# Patient Record
Sex: Male | Born: 1970 | Race: White | Hispanic: No | Marital: Single | Smoking: Heavy tobacco smoker
Health system: Southern US, Community
[De-identification: ages and names within clinical notes are randomized; demographics above are authoritative.]

## PROBLEM LIST (undated history)

## (undated) DIAGNOSIS — R45851 Suicidal ideations: Secondary | ICD-10-CM

---

## 2017-01-02 ENCOUNTER — Encounter (HOSPITAL_COMMUNITY): Payer: Self-pay | Admitting: Radiology

## 2017-01-02 ENCOUNTER — Emergency Department (HOSPITAL_COMMUNITY): Payer: Medicare HMO

## 2017-01-02 ENCOUNTER — Emergency Department (HOSPITAL_COMMUNITY)
Admission: EM | Admit: 2017-01-02 | Discharge: 2017-01-02 | Disposition: A | Discharge status: Home / Self Care | Payer: Medicare HMO | Attending: Emergency Medicine | Admitting: Emergency Medicine

## 2017-01-02 DIAGNOSIS — R1011 Right upper quadrant pain: Secondary | ICD-10-CM | POA: Insufficient documentation

## 2017-01-02 DIAGNOSIS — R110 Nausea: Secondary | ICD-10-CM | POA: Insufficient documentation

## 2017-01-02 DIAGNOSIS — R1013 Epigastric pain: Secondary | ICD-10-CM

## 2017-01-02 DIAGNOSIS — K8050 Calculus of bile duct without cholangitis or cholecystitis without obstruction: Secondary | ICD-10-CM

## 2017-01-02 LAB — CBC
HCT: 36.1 % — ABNORMAL LOW (ref 39.0–52.0)
Hemoglobin: 12.2 g/dL — ABNORMAL LOW (ref 13.0–17.0)
MCH: 30 pg (ref 26.0–34.0)
MCHC: 33.8 g/dL (ref 30.0–36.0)
MCV: 88.9 fL (ref 78.0–100.0)
PLATELETS: 246 10*3/uL (ref 150–400)
RBC: 4.06 MIL/uL — AB (ref 4.22–5.81)
RDW: 12.8 % (ref 11.5–15.5)
WBC: 14.7 10*3/uL — ABNORMAL HIGH (ref 4.0–10.5)

## 2017-01-02 LAB — HEPATIC FUNCTION PANEL
ALBUMIN: 3.1 g/dL — AB (ref 3.5–5.0)
ALK PHOS: 84 U/L (ref 38–126)
ALT: 77 U/L — AB (ref 17–63)
AST: 195 U/L — ABNORMAL HIGH (ref 15–41)
BILIRUBIN TOTAL: 0.7 mg/dL (ref 0.3–1.2)
Bilirubin, Direct: 0.2 mg/dL (ref 0.1–0.5)
Indirect Bilirubin: 0.5 mg/dL (ref 0.3–0.9)
Total Protein: 5.3 g/dL — ABNORMAL LOW (ref 6.5–8.1)

## 2017-01-02 LAB — BASIC METABOLIC PANEL
Anion gap: 6 (ref 5–15)
BUN: 19 mg/dL (ref 6–20)
CHLORIDE: 111 mmol/L (ref 101–111)
CO2: 24 mmol/L (ref 22–32)
CREATININE: 0.8 mg/dL (ref 0.61–1.24)
Calcium: 8.8 mg/dL — ABNORMAL LOW (ref 8.9–10.3)
GFR calc Af Amer: >60 mL/min (ref >60)
GFR calc non Af Amer: >60 mL/min (ref >60)
Glucose, Bld: 113 mg/dL — ABNORMAL HIGH (ref 65–99)
Potassium: 3.5 mmol/L (ref 3.5–5.1)
Sodium: 141 mmol/L (ref 135–145)

## 2017-01-02 LAB — I-STAT TROPONIN, ED: Troponin i, poc: 0.01 ng/mL (ref 0.00–0.08)

## 2017-01-02 LAB — LIPASE, BLOOD: LIPASE: 26 U/L (ref 11–51)

## 2017-01-02 MED ORDER — MORPHINE SULFATE (PF) 4 MG/ML IV SOLN
4.0000 mg | Freq: Once | INTRAVENOUS | Status: AC
  Administered 2017-01-02: 4 mg via INTRAVENOUS
  Filled 2017-01-02: qty 1

## 2017-01-02 MED ORDER — SODIUM CHLORIDE 0.9 % IV BOLUS (SEPSIS)
1000.0000 mL | Freq: Once | INTRAVENOUS | Status: AC
  Administered 2017-01-02: 1000 mL via INTRAVENOUS

## 2017-01-02 MED ORDER — ONDANSETRON HCL 4 MG/2ML IJ SOLN
4.0000 mg | Freq: Once | INTRAMUSCULAR | Status: AC
  Administered 2017-01-02: 4 mg via INTRAVENOUS
  Filled 2017-01-02: qty 2

## 2017-01-02 MED ORDER — IOPAMIDOL (ISOVUE-300) INJECTION 61%
INTRAVENOUS | Status: AC
  Administered 2017-01-02: 100 mL
  Filled 2017-01-02: qty 100

## 2017-01-02 NOTE — ED Provider Notes (Signed)
[facility]-EMER[facility]ENCY DEPT Provider Note   CSN: [id number] Arrival date & time: 01/02/17  0355     History   Chief Complaint Chief Complaint  Patient presents with  . Chest Pain    HPI Aura DialsSteven Range is a 46 y.o. male.  Patient is a 46 year old male with no prior medical history. He presents for evaluation of epigastric discomfort. This started several hours prior to presentation and is worsening. He feels nauseated but has not vomited. He denies any shortness of breath or diaphoresis. He denies any recent exertional symptoms.   The history is provided by the patient.  Abdominal Pain   This is a new problem. The current episode started 3 to 5 hours ago. The problem occurs constantly. The problem has been rapidly worsening. The pain is located in the epigastric region. The quality of the pain is cramping. The pain is moderate. Associated symptoms include nausea. Pertinent negatives include diarrhea and dysuria. The symptoms are aggravated by palpation. Nothing relieves the symptoms.    No past medical history on file.  There are no active problems to display for this patient.   No past surgical history on file.     Home Medications    Prior to Admission medications   Not on File    Family History No family history on file.  Social History Social History  Substance Use Topics  . Smoking status: Not on file  . Smokeless tobacco: Not on file  . Alcohol use Not on file     Allergies   Patient has no allergy information on record.   Review of Systems Review of Systems  Gastrointestinal: Positive for abdominal pain and nausea. Negative for diarrhea.  Genitourinary: Negative for dysuria.  All other systems reviewed and are negative.    Physical Exam Updated Vital Signs BP (!) 129/98   Pulse 85   Temp 97.8 F (36.6 C) (Oral)   Resp 18   SpO2 94%   Physical Exam  Constitutional: He is oriented to person, place, and time. He appears well-developed and  well-nourished. No distress.  HENT:  Head: Normocephalic and atraumatic.  Mouth/Throat: Oropharynx is clear and moist.  Neck: Normal range of motion. Neck supple.  Cardiovascular: Normal rate and regular rhythm.  Exam reveals no friction rub.   No murmur heard. Pulmonary/Chest: Effort normal and breath sounds normal. No respiratory distress. He has no wheezes. He has no rales.  Abdominal: Soft. Bowel sounds are normal. He exhibits no distension. There is tenderness. There is no rebound and no guarding.  There is tenderness to palpation in the epigastric region.  Musculoskeletal: Normal range of motion. He exhibits no edema.  Neurological: He is alert and oriented to person, place, and time. Coordination normal.  Skin: Skin is warm and dry. He is not diaphoretic.  Nursing note and vitals reviewed.    ED Treatments / Results  Labs (all labs ordered are listed, but only abnormal results are displayed) Labs Reviewed  BASIC METABOLIC PANEL - Abnormal; Notable for the following:       Result Value   Glucose, Bld 113 (*)    Calcium 8.8 (*)    All other components within normal limits  CBC - Abnormal; Notable for the following:    WBC 14.7 (*)    RBC 4.06 (*)    Hemoglobin 12.2 (*)    HCT 36.1 (*)    All other components within normal limits  HEPATIC FUNCTION PANEL  LIPASE, BLOOD  I-STAT TROPOININ, ED  EKG  EKG Interpretation  Date/Time:  Wednesday January 02 2017 03:55:51 EDT Ventricular Rate:  100 PR Interval:    QRS Duration: 85 QT Interval:  329 QTC Calculation: 425 R Axis:   42 Text Interpretation:  Sinus tachycardia Probable left atrial enlargement ST elev, probable normal early repol pattern Confirmed by Geoffery Lyons ([id number]) on 01/02/2017 4:44:00 AM       Radiology Dg Chest 2 View  Result Date: 01/02/2017 CLINICAL DATA:  Acute onset of mid lower chest pain and epigastric abdominal pain. Initial encounter. EXAM: CHEST  2 VIEW COMPARISON:  None. FINDINGS: The lungs  are well-aerated and clear. There is no evidence of focal opacification, pleural effusion or pneumothorax. The heart is normal in size; the mediastinal contour is within normal limits. No acute osseous abnormalities are seen. IMPRESSION: No acute cardiopulmonary process seen. Electronically Signed   By: Roanna Raider M.D.   On: 01/02/2017 04:40    Procedures Procedures (including critical care time)  Medications Ordered in ED Medications  sodium chloride 0.9 % bolus 1,000 mL (not administered)  morphine 4 MG/ML injection 4 mg (not administered)  ondansetron (ZOFRAN) injection 4 mg (not administered)     Initial Impression / Assessment and Plan / ED Course  I have reviewed the triage vital signs and the nursing notes.  Pertinent labs & imaging results that were available during my care of the patient were reviewed by me and considered in my medical decision making (see chart for details).  Patient presents with complaints of epigastric and right upper quadrant pain that started this evening. His workup reveals a white count of 15,000 with a mild bump in his LFTs. Ultrasound shows sludge, but no evidence for cholecystitis. Patient has been given 2 separate doses of pain medicine, and continues with significant discomfort. For this reason, a CT scan of the abdomen will be obtained to rule out alternate intra-abdominal pathology. His care will be signed out to Dr. Ethelda Chick at shift change. He will obtain the results of the CT scan of determine the final disposition.  Final Clinical Impressions(s) / ED Diagnoses   Final diagnoses:  Epigastric pain    New Prescriptions New Prescriptions   No medications on file     Geoffery Lyons, MD 01/03/17 [phone]

## 2017-01-02 NOTE — ED Notes (Signed)
EDP at bedside  

## 2017-01-02 NOTE — Discharge Instructions (Signed)
It is okay to take Advil for pain. Call [facility] to schedule the next available appointment. Tell office staff that you were seen here. Return for fever, vomiting or pain not well controlled or if concern for any reason

## 2017-01-02 NOTE — ED Provider Notes (Addendum)
Complains of right upper quadrant pain onset 1 AM today. Pain is constant. He admits to eating pizza 10 PM yesterday. No nausea or vomiting. Nothing makes symptoms better or worse.. No fever. No chest pain. On exam alert no distress lungs clear auscultation heart regular rate and rhythm abdomen nondistended tender right upper quadrant without guarding rigidity or rebound   Doug Sou, MD 01/02/17 0827 9:50 AM patient is sleeping easily arousable. No distress. States pain has improved. I discussed case with Ms. Focht,, PA-C who in turn discussed case with her attending Dr. Derrell Lolling. Plan patient is to follow up at [facility] surgery clinic to call for appointment.  Symptoms are consistent with biliary colic. Plan Advil for pain. Return precautions given fever, vomiting, pain not well controlled Results for orders placed or performed during the hospital encounter of 01/02/17  Basic metabolic panel  Result Value Ref Range   Sodium 141 135 - 145 mmol/L   Potassium 3.5 3.5 - 5.1 mmol/L   Chloride 111 101 - 111 mmol/L   CO2 24 22 - 32 mmol/L   Glucose, Bld 113 (H) 65 - 99 mg/dL   BUN 19 6 - 20 mg/dL   Creatinine, Ser 1.61 0.61 - 1.24 mg/dL   Calcium 8.8 (L) 8.9 - 10.3 mg/dL   GFR calc non Af Amer >60 >60 mL/min   GFR calc Af Amer >60 >60 mL/min   Anion gap 6 5 - 15  CBC  Result Value Ref Range   WBC 14.7 (H) 4.0 - 10.5 K/uL   RBC 4.06 (L) 4.22 - 5.81 MIL/uL   Hemoglobin 12.2 (L) 13.0 - 17.0 g/dL   HCT 09.6 (L) 04.5 - 40.9 %   MCV 88.9 78.0 - 100.0 fL   MCH 30.0 26.0 - 34.0 pg   MCHC 33.8 30.0 - 36.0 g/dL   RDW 81.1 91.4 - 78.2 %   Platelets 246 150 - 400 K/uL  Hepatic function panel  Result Value Ref Range   Total Protein 5.3 (L) 6.5 - 8.1 g/dL   Albumin 3.1 (L) 3.5 - 5.0 g/dL   AST 956 (H) 15 - 41 U/L   ALT 77 (H) 17 - 63 U/L   Alkaline Phosphatase 84 38 - 126 U/L   Total Bilirubin 0.7 0.3 - 1.2 mg/dL   Bilirubin, Direct 0.2 0.1 - 0.5 mg/dL   Indirect Bilirubin 0.5 0.3 -  0.9 mg/dL  Lipase, blood  Result Value Ref Range   Lipase 26 11 - 51 U/L  I-stat troponin, ED  Result Value Ref Range   Troponin i, poc 0.01 0.00 - 0.08 ng/mL   Comment 3           Dg Chest 2 View  Result Date: 01/02/2017 CLINICAL DATA:  Acute onset of mid lower chest pain and epigastric abdominal pain. Initial encounter. EXAM: CHEST  2 VIEW COMPARISON:  None. FINDINGS: The lungs are well-aerated and clear. There is no evidence of focal opacification, pleural effusion or pneumothorax. The heart is normal in size; the mediastinal contour is within normal limits. No acute osseous abnormalities are seen. IMPRESSION: No acute cardiopulmonary process seen. Electronically Signed   By: Roanna Raider M.D.   On: 01/02/2017 04:40   Ct Abdomen Pelvis W Contrast  Result Date: 01/02/2017 CLINICAL DATA:  Epigastric and right upper quadrant abdominal pain with leukocytosis. EXAM: CT ABDOMEN AND PELVIS WITH CONTRAST TECHNIQUE: Multidetector CT imaging of the abdomen and pelvis was performed using the standard protocol following bolus administration  of intravenous contrast. CONTRAST:  100mL ISOVUE-300 IOPAMIDOL (ISOVUE-300) INJECTION 61% COMPARISON:  Right upper quadrant abdominal ultrasound earlier today. FINDINGS: Lower chest: No acute abnormality. Hepatobiliary: The liver demonstrates diffuse steatosis. No overt cirrhotic changes or evidence of focal hepatic masses. No biliary ductal dilatation identified. The gallbladder appears unremarkable by CT. Pancreas: Unremarkable. No pancreatic ductal dilatation or surrounding inflammatory changes. Spleen: Normal in size without focal abnormality. Adrenals/Urinary Tract: Adrenal glands are unremarkable. Kidneys are normal, without renal calculi, focal lesion, or hydronephrosis. Bladder is unremarkable. Stomach/Bowel: Bowel shows no evidence of inflammation or obstruction. No lesions identified. No abnormal fluid collections or free intraperitoneal air. The appendix is not  well visualized. No findings to suggest acute appendicitis. Vascular/Lymphatic: No significant vascular findings are present. No enlarged abdominal or pelvic lymph nodes. Reproductive: Prostate is unremarkable. Other: No ascites. Anterior abdominal wall hernia mesh identified without evidence of hernia. Musculoskeletal: No acute or significant osseous findings. Evidence of prior posterior lumbar interbody fusion at L4-5. IMPRESSION: 1. No acute findings in the abdomen or pelvis. 2. Evidence of hepatic steatosis without overt cirrhosis or focal hepatic lesion. 3. Prior ventral abdominal wall hernia repair with mesh present. No evidence of recurrent hernia. Electronically Signed   By: Irish LackGlenn  Yamagata M.D.   On: 01/02/2017 08:30   [place] Abdomen Limited Ruq  Result Date: 01/02/2017 CLINICAL DATA:  Epigastric pain EXAM: ULTRASOUND ABDOMEN LIMITED RIGHT UPPER QUADRANT COMPARISON:  None. FINDINGS: Gallbladder: Arm sludge within the gallbladder. No stones or wall thickening. Negative sonographic Murphy's. Common bile duct: Diameter: Normal caliber 3 mm. Liver: Increased echotexture compatible with fatty infiltration. No focal abnormality or biliary ductal dilatation. IMPRESSION: Fatty infiltration of the liver. Small amount of sludge in the gallbladder. No stones or evidence of cholecystitis. Electronically Signed   By: Charlett NoseKevin  Dover M.D.   On: 01/02/2017 07:09     Doug SouJacubowitz, Makelle Marrone, MD 01/02/17 1001

## 2017-01-02 NOTE — ED Triage Notes (Signed)
PT from home via [facility]. Pt c/o centralized CP that radiates to his back. Endorses diaphoresis, weakness, SHOB & pain on inspiration. Did heavy moving x1 wk ago. Non compliant w/ prescribed meds for HTN. Given 324 ASA & 2 NTG PTA by EMS. A&O x4 on arrival.

## 2017-03-10 ENCOUNTER — Inpatient Hospital Stay (HOSPITAL_COMMUNITY)
Admission: AD | Admit: 2017-03-10 | Discharge: 2017-03-19 | DRG: 885 | Disposition: A | Discharge status: Home / Self Care | Payer: PRIVATE HEALTH INSURANCE | Source: Intra-hospital | Attending: Psychiatry | Admitting: Psychiatry

## 2017-03-10 ENCOUNTER — Encounter (HOSPITAL_COMMUNITY): Payer: Self-pay

## 2017-03-10 DIAGNOSIS — Z598 Other problems related to housing and economic circumstances: Secondary | ICD-10-CM | POA: Diagnosis not present

## 2017-03-10 DIAGNOSIS — Z590 Homelessness: Secondary | ICD-10-CM

## 2017-03-10 DIAGNOSIS — R45851 Suicidal ideations: Secondary | ICD-10-CM | POA: Diagnosis present

## 2017-03-10 DIAGNOSIS — F17210 Nicotine dependence, cigarettes, uncomplicated: Secondary | ICD-10-CM | POA: Diagnosis present

## 2017-03-10 DIAGNOSIS — M25512 Pain in left shoulder: Secondary | ICD-10-CM | POA: Diagnosis present

## 2017-03-10 DIAGNOSIS — G8929 Other chronic pain: Secondary | ICD-10-CM | POA: Diagnosis present

## 2017-03-10 DIAGNOSIS — F1121 Opioid dependence, in remission: Secondary | ICD-10-CM | POA: Diagnosis present

## 2017-03-10 DIAGNOSIS — R450 Nervousness: Secondary | ICD-10-CM | POA: Diagnosis not present

## 2017-03-10 DIAGNOSIS — F411 Generalized anxiety disorder: Secondary | ICD-10-CM | POA: Diagnosis present

## 2017-03-10 DIAGNOSIS — I10 Essential (primary) hypertension: Secondary | ICD-10-CM | POA: Diagnosis present

## 2017-03-10 DIAGNOSIS — Z79899 Other long term (current) drug therapy: Secondary | ICD-10-CM | POA: Diagnosis not present

## 2017-03-10 DIAGNOSIS — F332 Major depressive disorder, recurrent severe without psychotic features: Principal | ICD-10-CM | POA: Diagnosis present

## 2017-03-10 DIAGNOSIS — F1290 Cannabis use, unspecified, uncomplicated: Secondary | ICD-10-CM | POA: Diagnosis not present

## 2017-03-10 DIAGNOSIS — G4709 Other insomnia: Secondary | ICD-10-CM | POA: Diagnosis present

## 2017-03-10 DIAGNOSIS — G4700 Insomnia, unspecified: Secondary | ICD-10-CM | POA: Diagnosis not present

## 2017-03-10 DIAGNOSIS — F419 Anxiety disorder, unspecified: Secondary | ICD-10-CM | POA: Diagnosis not present

## 2017-03-10 MED ORDER — NICOTINE 21 MG/24HR TD PT24
21.0000 mg | MEDICATED_PATCH | Freq: Every day | TRANSDERMAL | Status: DC
  Administered 2017-03-10 – 2017-03-15 (×6): 21 mg via TRANSDERMAL
  Filled 2017-03-10 (×9): qty 1
  Filled 2017-03-10: qty 7
  Filled 2017-03-10 (×3): qty 1

## 2017-03-10 MED ORDER — ACETAMINOPHEN 325 MG PO TABS
650.0000 mg | ORAL_TABLET | Freq: Four times a day (QID) | ORAL | Status: DC | PRN
  Administered 2017-03-10: 650 mg via ORAL
  Filled 2017-03-10: qty 2

## 2017-03-10 MED ORDER — TRAZODONE HCL 50 MG PO TABS
50.0000 mg | ORAL_TABLET | Freq: Every evening | ORAL | Status: DC | PRN
  Administered 2017-03-10 – 2017-03-15 (×6): 50 mg via ORAL
  Filled 2017-03-10 (×6): qty 1

## 2017-03-10 MED ORDER — ALUM & MAG HYDROXIDE-SIMETH 200-200-20 MG/5ML PO SUSP: 30.0000 mL | ORAL | Status: DC | PRN

## 2017-03-10 MED ORDER — MAGNESIUM HYDROXIDE 400 MG/5ML PO SUSP
30.0000 mL | Freq: Every day | ORAL | Status: DC | PRN
  Administered 2017-03-14: 30 mL via ORAL
  Filled 2017-03-10: qty 30

## 2017-03-10 MED ORDER — HYDROXYZINE HCL 25 MG PO TABS
25.0000 mg | ORAL_TABLET | Freq: Three times a day (TID) | ORAL | Status: DC | PRN
  Administered 2017-03-10 – 2017-03-18 (×10): 25 mg via ORAL
  Filled 2017-03-10 (×10): qty 1
  Filled 2017-03-10: qty 10

## 2017-03-10 NOTE — BH Assessment (Signed)
Tele Assessment Note   Patient Name: Dominic DialsSteven Spencer MRN: [id number] Referring Physician: [facility] Direct Admit / Donnita FallsJody Osborne, MD Location of Patient: [facility]-400B IP ADULT Location of Provider: Behavioral Health TTS Department  Dominic DialsSteven Spencer is an 46 y.o. male who [facility] as [facility] for his suicidal ideation and depression. Patient  reported increasing depression over the past several days.  He states that he was experiencing suicidal ideation, but did not identify a plan. Patient admitted to a history of depression and prior treatment with medications, but stated that he hd been off his medications for several months.  He stated that he recently moved [place] to [place].  Patient states that he started drinking approximately four days ago to self-medicate his depressive symptoms. His depressive symptoms included: increased anxiety, feeling hopeless, excessive worrying.  Patient admits to drinking 2-3 beers daily over the past four days.  He also admits to occasional use of marijuana.  Patient did not disclose a history of opioid addiction to the referring hospital, but most likely he has an opioid addiction history because he is prescribed suboxone.  Patient is currently homeless and has minimal support.  He was unable to contract for safety and was requesting admission to an inpatient psych hospital.  Due to his inability to contract for safety, his lack of support and resources, he was accepted [facility].  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  No past surgical history on file.  Family History: No family history on file.  Social History:  has no tobacco, alcohol, and drug history on file.  Additional Social History:  Alcohol / Drug Use Pain Medications: denies (hx of opioid addiction, currently prescribed  subutex) Prescriptions: denies (has prescription medications, no history of abusing them) Over the Counter: denies (denies) History of alcohol / drug use?: Yes Longest period of sobriety (when/how long):  (unknown) Negative Consequences of Use: Financial Withdrawal Symptoms:  (none reported) Substance #1 Name of Substance 1: alcohol (alcohol) 1 - Age of First Use: unknown 1 - Amount (size/oz): 2-3 beers 1 - Frequency: daily 1 - Duration: 4 months 1 - Last Use / Amount: today  CIWA:   COWS:    PATIENT STRENGTHS: (choose at least two) Ability for insight Motivation for treatment/growth  Allergies: No Known Allergies  Home Medications:  No prescriptions prior to admission.    OB/GYN Status:  No LMP for male patient.  General Assessment Data Location of Assessment: [facility] Assessment Services TTS Assessment: Out of system Is this a Tele or Face-to-Face Assessment?: Tele Assessment Is this an Initial Assessment or a Re-assessment for this encounter?: Initial Assessment Marital status: Single Maiden name:  (not assessed) Is patient pregnant?:  (N/A) Pregnancy Status:  (N/A) Living Arrangements: Alone Can pt return to current living arrangement?: Yes Admission Status: Voluntary Is patient capable of signing voluntary admission?: Yes Referral Source: Other [facility] Insurance type:  (none)  Medical Screening Exam [facility] Walk-in ONLY) Medical Exam completed: Yes  Crisis Care Plan Living Arrangements: Alone Legal Guardian:  [facility] Name of Psychiatrist:  (just moved from [place], has no provider) Name of Therapist:  (none reported)  Education Status Is patient currently in school?: No Current Grade:  (NA) Highest grade of school patient has completed:  (not assessed) Name of school:  (unknown) Contact person:  ([place])  Risk to self with the past 6  months Suicidal Ideation: Yes-Currently Present (no plan, unable to contract for safety) Has patient been  a risk to self within the past 6 months prior to admission? : No Suicidal Intent: No Has patient had any suicidal intent within the past 6 months prior to admission? : No Is patient at risk for suicide?: Yes (not contracting for safety) Suicidal Plan?: No Has patient had any suicidal plan within the past 6 months prior to admission? : No Access to Means: No What has been your use of drugs/alcohol within the last 12 months?:  (drinking for the past four days and has  hx of THC use) Previous Attempts/Gestures: No How many times?:  (none reported) Other Self Harm Risks:  (homeless, unemplyed and minimal support) Triggers for Past Attempts: Unknown (no prior attempts reported) Intentional Self Injurious Behavior: None Family Suicide History:  (not assessed/none reported) Recent stressful life event(s): Other (Comment) (homeless, unemployed and recent move) Persecutory voices/beliefs?: No Depression:  (Depressed Mood, hopeless and has anxiety) Depression Symptoms: Fatigue, Loss of interest in usual pleasures Substance abuse history and/or treatment for substance abuse?: Yes (no specifics reported but is prescribed subutex) Suicide prevention information given to non-admitted patients: Not applicable  Risk to Others within the past 6 months Homicidal Ideation: No Does patient have any lifetime risk of violence toward others beyond the six months prior to admission? : No Thoughts of Harm to Others: No Current Homicidal Intent: No Current Homicidal Plan: No Access to Homicidal Means: No Identified Victim:  (none) History of harm to others?: No (none reported) Assessment of Violence:  (none reported) Violent Behavior Description:  (none reported) Does patient have access to weapons?: No (none reported) Criminal Charges Pending?: No Does patient have a court date: No Is patient on probation?: No  Psychosis Hallucinations: None noted Delusions: None noted  Mental Status  Report Appearance/Hygiene: Unremarkable Eye Contact: Good Motor Activity: Unremarkable Speech: Unremarkable Level of Consciousness: Alert Mood: Depressed, Anxious Affect: Other (Comment) (congruent with mood) Anxiety Level: Moderate (congruent with mood) Thought Processes: Coherent Judgement: Impaired Orientation: Person, Place, Time, Situation Obsessive Compulsive Thoughts/Behaviors: None  Cognitive Functioning Concentration: Normal Memory: Recent Intact, Remote Intact IQ: Average Insight: Good Impulse Control: Fair Appetite: Good Weight Loss:  (none reported) Weight Gain:  (none reported) Sleep: No Change Total Hours of Sleep:  (not assessed) Vegetative Symptoms: None  ADLScreening [facility] Assessment Services) Patient's cognitive ability adequate to safely complete daily activities?: Yes Patient able to express need for assistance with ADLs?: Yes Independently performs ADLs?: Yes (appropriate for developmental age)  Prior Inpatient Therapy Prior Inpatient Therapy:  (unknown) Prior Therapy Dates:  (none reported) Prior Therapy Facilty/Provider(s):  (none reported) Reason for Treatment:  (none reported)  Prior Outpatient Therapy Prior Outpatient Therapy:  (unknown/not assessed) Prior Therapy Dates:  (unknown/not assessed) Prior Therapy Facilty/Provider(s):  (unknown/not assessed) Reason for Treatment:  (unknown/not assessed) Does patient have an ACCT team?: No Does patient have Intensive In-House Services?  : No Does patient have [facility] services? : No Does patient have P4CC services?: No  ADL Screening (condition at time of admission) Patient's cognitive ability adequate to safely complete daily activities?: Yes Is the patient deaf or have difficulty hearing?: No Does the patient have difficulty seeing, even when wearing glasses/contacts?: No Does the patient have difficulty concentrating, remembering, or making decisions?: No Patient able to express need for  assistance with ADLs?: Yes Does the patient have difficulty dressing or bathing?: No Independently performs ADLs?: Yes (appropriate for developmental age) Does the patient have  difficulty walking or climbing stairs?: No Weakness of Legs: None Weakness of Arms/Hands: None             Advance Directives (For Healthcare) Does Patient Have a Medical Advance Directive?:  (unknown)    Additional Information 1:1 In Past 12 Months?: No CIRT Risk: No Elopement Risk: No Does patient have medical clearance?: No  Child/Adolescent Assessment Running Away Risk:  (N/A) Bed-Wetting:  (N/A) Destruction of Property:  (N/A) Cruelty to Animals:  (N/A) Stealing:  (N/A) Rebellious/Defies Authority:  (N/A) Satanic Involvement:  (N/A) Fire Setting:  (N/A) Problems at School:  (N/A) Gang Involvement:  (N/A)  Disposition:  Inpatient Psych Disposition Initial Assessment Completed for this Encounter: Yes Disposition of Patient: Inpatient treatment program Type of inpatient treatment program: Adult  This service was provided via telemedicine using a 2-way, interactive audio and Immunologist.  Names of all persons participating in this telemedicine service and their role in this encounter. Name:Brandi Levette Role: LPC, Triage Specialist  Name: Nadiah Corbit Role: Jamesetta Geralds Specialist  Name: Role:   Name:  Role:     Daphene Calamity 03/10/2017 3:53 PM

## 2017-03-10 NOTE — Tx Team (Signed)
Initial Treatment Plan 03/10/2017 5:47 PM Dominic Spencer [id number]    PATIENT STRESSORS: Loss of housing and job.   PATIENT STRENGTHS: Motivation for treatment/growth   PATIENT IDENTIFIED PROBLEMS: Depression  Suicidality  Anxiety  "Ret rid of the pain."  "Right medications for depression and sleep."             DISCHARGE CRITERIA:  Ability to meet basic life and health needs Improved stabilization in mood, thinking, and/or behavior  PRELIMINARY DISCHARGE PLAN: Attend 12-step recovery group  PATIENT/FAMILY INVOLVEMENT: This treatment plan has been presented to and reviewed with the patient, Dominic Spencer.  The patient has been given the opportunity to ask questions and make suggestions.  Cheri Kearns, RN 03/10/2017, 5:47 PM

## 2017-03-10 NOTE — Progress Notes (Signed)
Writer spoke with patient 1:1 shortly after shift change. He inquired about his suboxene and toradol. Writer informed him about the procedure for his medications needing to be verified and the possibility of them not being ordered if not current. Writer informed him of medications available if needed and he requested something for anxiety which he received. Writer informed him that he would see a doctor on tomorrow to discuss his medications needed. He has been up in the dayroom watching tv and interacting appropriately with peers. Support given and safety maintained on unit with 15 min checks.

## 2017-03-10 NOTE — Progress Notes (Signed)
Patient did attend the evening speaker AA meeting.  

## 2017-03-10 NOTE — Progress Notes (Signed)
Patient is a 46 year old man admitted voluntarily from [facility]. Patient reports he was depressed and took himself to the hospital, where he told them, "I just want to die. I don't know what to do." Patient recently moved, with his GF and her kids from [place], into a friends trailer, with the friend and her family. This caused a great deal of friction between the two families and he and his GF and  kids, "Were kicked out. My GF and the kids went to a women's shelter. I tried to get into a men's shelter but was not able to. I have been couch hopping since then." Patient says he has been depressed, sleeping less and is anxious. Patient also reports, "I took about 20 Advil at one time about a month, then about an hour later I took another 7920. It didn't work and I never told anyone." patient is able to contract for safety while on the unit. Skin assessment shows, two vertical scars on either side of spin, on lower back from L4-5 fusion, R. 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 HX of hernia repair and bilateral bunionectomy. Patient reports history of multiple psychiatric admissions in [place]. Home meds:   Zubsolv   Neurontin    Prozac   Clonidine Paperwork completed. Personal belongings secured in locker. Oriented to unit. Q 15 minute checks initiated

## 2017-03-11 DIAGNOSIS — F332 Major depressive disorder, recurrent severe without psychotic features: Principal | ICD-10-CM

## 2017-03-11 DIAGNOSIS — F17210 Nicotine dependence, cigarettes, uncomplicated: Secondary | ICD-10-CM

## 2017-03-11 DIAGNOSIS — Z598 Other problems related to housing and economic circumstances: Secondary | ICD-10-CM

## 2017-03-11 DIAGNOSIS — F419 Anxiety disorder, unspecified: Secondary | ICD-10-CM

## 2017-03-11 DIAGNOSIS — I10 Essential (primary) hypertension: Secondary | ICD-10-CM

## 2017-03-11 MED ORDER — LISINOPRIL 10 MG PO TABS
10.0000 mg | ORAL_TABLET | Freq: Every day | ORAL | Status: DC
Start: 2017-03-11 — End: 2017-03-19
  Administered 2017-03-11 – 2017-03-19 (×9): 10 mg via ORAL
  Filled 2017-03-11 (×4): qty 1
  Filled 2017-03-11 (×2): qty 7
  Filled 2017-03-11 (×2): qty 1
  Filled 2017-03-11: qty 2
  Filled 2017-03-11: qty 1
  Filled 2017-03-11: qty 2
  Filled 2017-03-11 (×3): qty 1

## 2017-03-11 MED ORDER — GABAPENTIN 300 MG PO CAPS
300.0000 mg | ORAL_CAPSULE | Freq: Three times a day (TID) | ORAL | Status: DC
  Administered 2017-03-11 – 2017-03-12 (×4): 300 mg via ORAL
  Filled 2017-03-11 (×10): qty 1

## 2017-03-11 MED ORDER — IBUPROFEN 400 MG PO TABS
400.0000 mg | ORAL_TABLET | Freq: Four times a day (QID) | ORAL | Status: DC | PRN
  Administered 2017-03-11 – 2017-03-16 (×3): 400 mg via ORAL
  Filled 2017-03-11 (×3): qty 1

## 2017-03-11 MED ORDER — DULOXETINE HCL 30 MG PO CPEP
30.0000 mg | ORAL_CAPSULE | Freq: Every day | ORAL | Status: DC
  Administered 2017-03-11 – 2017-03-12 (×2): 30 mg via ORAL
  Filled 2017-03-11 (×4): qty 1

## 2017-03-11 NOTE — [facility] Group Notes (Signed)
LCSW Group Therapy Note   03/11/2017 1:15pm   Type of Therapy and Topic:  Group Therapy:  Overcoming Obstacles   Participation Level:  None--pt pulled out of group by MD at beginning of group. DID NOT ATTEND.   Description of Group:    In this group patients will be encouraged to explore what they see as obstacles to their own wellness and recovery. They will be guided to discuss their thoughts, feelings, and behaviors related to these obstacles. The group will process together ways to cope with barriers, with attention given to specific choices patients can make. Each patient will be challenged to identify changes they are motivated to make in order to overcome their obstacles. This group will be process-oriented, with patients participating in exploration of their own experiences as well as giving and receiving support and challenge from other group members.   Therapeutic Goals: 1. Patient will identify personal and current obstacles as they relate to admission. 2. Patient will identify barriers that currently interfere with their wellness or overcoming obstacles.  3. Patient will identify feelings, thought process and behaviors related to these barriers. 4. Patient will identify two changes they are willing to make to overcome these obstacles:      Summary of Patient Progress      Therapeutic Modalities:   Cognitive Behavioral Therapy Solution Focused Therapy Motivational Interviewing Relapse Prevention Therapy  Ledell PeoplesHeather N Smart, LCSW 03/11/2017 2:07 PM

## 2017-03-11 NOTE — Plan of Care (Signed)
Problem: Self-Concept: Goal: Ability to disclose and discuss suicidal ideas will improve Outcome: Progressing Pt able to verbalize increase anxiety level and requested an antianxiety medication. Pt verbalizes SI with no plan or intent. Pt verbally contracts for safety.

## 2017-03-11 NOTE — Progress Notes (Signed)
D: Pt presents with a flat affect and depressed mood. Pt rates depression 7/10. Anxiety 8/10. Hopelessness 6/10. Pt endorses suicidal thoughts with no plan or intent. Pt verbally contracts for safety. Pt denies AVH. Pt denies any withdrawal symptoms. Pt stated that he was on Suboxone but stopped about four months ago. Pt stated that he was started on Subutex while in the ED.  A: Medications reviewed with pt. Medications administered as ordered per MD. Verbal support provided. Pt encouraged to attend groups. 15 minute checks performed for safety. R: Pt receptive to tx.

## 2017-03-11 NOTE — [facility] Suicide Risk Assessment (Signed)
[facility] INPATIENT:  Family/Significant Other Suicide Prevention Education  Suicide Prevention Education:  Patient Refusal for Family/Significant Other Suicide Prevention Education: The patient Dominic DialsSteven Spencer has refused to provide written consent for family/significant other to be provided Family/Significant Other Suicide Prevention Education during admission and/or prior to discharge.  Physician notified.  Verdene LennertLauren C Rafferty Postlewait 03/11/2017, 12:36 PM

## 2017-03-11 NOTE — Plan of Care (Signed)
Problem: Safety: Goal: Periods of time without injury will increase Outcome: Progressing Pt safe on unit at this time

## 2017-03-11 NOTE — [facility] Suicide Risk Assessment (Addendum)
[facility]   Nursing information obtained from:   patient and chart Demographic factors:   Divorced, was living  with GF and her 4 children , [place]  Current Mental Status:   see below Loss Factors:   recent relocation from [place] of state, recently became homeless  Historical Factors:   reports history of bipolar disorder.  Risk Reduction Factors:   resilience   Total Time spent with patient: 45 minutes Principal Problem: MDD (major depressive disorder), recurrent severe, without psychosis (HCC) Diagnosis:   Patient Active Problem List   Diagnosis Date Noted  . MDD (major depressive disorder), recurrent severe, without psychosis (HCC) [F33.2] 03/10/2017    Continued Clinical Symptoms:  Alcohol Use Disorder Identification Test Final Score (AUDIT): 21 The "Alcohol Use Disorders Identification Test", Guidelines for Use in Primary Care, Second Edition.  World Science writerHealth Organization [facility](WHO). Score between 0-7:  no or low risk or alcohol related problems. Score between 8-15:  moderate risk of alcohol related problems. Score between 16-19:  high risk of alcohol related problems. Score 20 or above:  warrants further diagnostic evaluation for alcohol dependence and treatment.   CLINICAL FACTORS:  Patient is a 46 year old male, who presented to ED voluntarily due to depression, suicidal ideations. He reports neuro-vegetative symptoms of depression- decreased energy, poor sleep, mild anhedonia.  Denies history of psychosis. He attributes his depression partially to chronic pain, and also to significant psychosocial stressors ( recent relocation and currently homeless).   States he has been drinking 2-3 beers per day over the last several days, but denies history of alcohol abuse   He reports prior history of Bipolar Disorder. History of several prior psychiatric admissions, most recently several months ago. He states he attempted suicide 15  years ago by cutting wrist . Denies history of psychosis.  [place] . States he has arthritis on lower back, and has had hip replacement.   Denies alcohol abuse, reports prior history of taking more opiates than prescribed . Also reports cannabis use 1-2 x week.  Dx- Bipolar Disorder, Depressed versus Depression secondary to medical illness ( Chronic Pain)    Plan- Inpatient admission He has been started on Cymbalta 30 mgrs QDAY and on Neurontin 300 mgrs TID LFTs were elevated in July- will repeat and also obtain TSH   Musculoskeletal: Strength & Muscle Tone: within normal limits Gait & Station: normal Patient leans: N/A  Psychiatric Specialty Exam: Physical Exam  ROS no headache, no chest pain, no vomiting, lower back pain, no fever, no chills , no rash  Blood pressure (!) 142/90, pulse (!) 106, temperature 97.7 F (36.5 C), temperature source Oral, resp. rate 16, height 6\' 2"  (1.88 m), weight 96.4 kg (212 lb 8 oz), SpO2 100 %.Body mass index is 27.28 kg/m.  General Appearance: Well Groomed  Eye Contact:  Good  Speech:  Normal Rate, no pressured speech  Volume:  Normal  Mood:  depressed  Affect:  Appropriate and reactive, smiles at times appropriately, not agitated or expansive   Thought Process:  Linear and Descriptions of Associations: Intact  No flight of ideations  Orientation:  Full (Time, Place, and Person)  Thought Content:  no hallucinations, no delusions, not internally preoccupied   Suicidal Thoughts:  No denies suicidal plan or intention, contracts for safety at this time, no homicidal ideations   Homicidal Thoughts:  No  Memory:  recent and  remote grossly intact   Judgement:  Fair  Insight:  Fair  Psychomotor Activity:  Normal  Concentration:  Concentration: Good and Attention Span: Good  Recall:  Good  Fund of Knowledge:  Good  Language:  Good  Akathisia:  Negative  Handed:  Right  AIMS (if  indicated):     Assets:  Communication Skills Desire for Improvement Resilience  ADL's:  Intact  Cognition:  WNL  Sleep:  Number of Hours: 5.25      COGNITIVE FEATURES THAT CONTRIBUTE TO RISK:  Closed-mindedness and Loss of executive function    SUICIDE RISK:   Moderate:  Frequent suicidal ideation with limited intensity, and duration, some specificity in terms of plans, no associated intent, good self-control, limited dysphoria/symptomatology, some risk factors present, and identifiable protective factors, including available and accessible social support.  PLAN OF CARE: Patient will be admitted to inpatient psychiatric unit for stabilization and safety. Will provide and encourage milieu participation. Provide medication management and maked adjustments as needed.  Will follow daily.    I certify that inpatient services furnished can reasonably be expected to improve the patient's condition.   Craige Cotta, MD 03/11/2017, 1:54 PM

## 2017-03-11 NOTE — H&P (Signed)
Psychiatric Admission Assessment Adult  Patient Identification: Dominic Spencer MRN:  [id number] Date of Evaluation:  03/11/2017 Chief Complaint:  MDD SEVERE Principal Diagnosis: MDD (major depressive disorder), recurrent severe, without psychosis ([place]) Diagnosis:   Patient Active Problem List   Diagnosis Date Noted  . MDD (major depressive disorder), recurrent severe, without psychosis ([place]) [F33.2] 03/10/2017   History of Present Illness:  TTS Admission Note: Patient is a 46 year old man admitted voluntarily from [facility]. Patient reports he was depressed and took himself to the hospital, where he told them, "I just want to die. I don't know what to do." Patient recently moved, with his GF and her kids from [place], into a friends trailer, with the friend and her family. This caused a great deal of friction between the two families and he and his GF and  kids, "Were kicked out. My GF and the kids went to a women's shelter. I tried to get into a men's shelter but was not able to. I have been couch hopping since then." Patient says he has been depressed, sleeping less and is anxious. Patient also reports, "I took about 20 Advil at one time about a month, then about an hour later I took another 21. It didn't work and I never told anyone." patient is able to contract for safety while on the unit. Home meds listed are Zubsolv, Neurontin, Prozac, Lisinopril, and Clonidine.  Patient reports today that he moved from [facility] 4 months ago and has been off of all of his medications since then. He stated he was living with his parents and then he and his GF moved here to live with friends, but he will not detail why they moved. He reports that he has a lot of nerve pain and that is what the Zubsolv and Neurontin are for. I inform him that since he has no where to continue it outpatient and he has been off of it for 4 months then it will not be continued and he agrees and states understanding. He reports  depression but denies SI/HI/AVH. He does not appear depressed and has appropriate affect. He reports that he is depressed due to his situation, but he then states that he will go to a shelter when discharged from here. He agrees to start Cymbalta and Neurontin. Patient seem to giving limited information and may be seeking secondary gain from the hospital.  Associated Signs/Symptoms: Depression Symptoms:  depressed mood, hopelessness, suicidal thoughts without plan, (Hypo) Manic Symptoms:  Denies Anxiety Symptoms:  Excessive Worry, Psychotic Symptoms:  Denies PTSD Symptoms: NA Total Time spent with patient: 30 minutes  Past Psychiatric History: Opiate abuse, depression  Is the patient at risk to self? No.  Has the patient been a risk to self in the past 6 months? No.  Has the patient been a risk to self within the distant past? No.  Is the patient a risk to others? No.  Has the patient been a risk to others in the past 6 months? No.  Has the patient been a risk to others within the distant past? No.   Prior Inpatient Therapy: Prior Inpatient Therapy:  (unknown) Prior Therapy Dates:  (none reported) Prior Therapy Facilty/Provider(s):  (none reported) Reason for Treatment:  (none reported) Prior Outpatient Therapy: Prior Outpatient Therapy:  (unknown/not assessed) Prior Therapy Dates:  (unknown/not assessed) Prior Therapy Facilty/Provider(s):  (unknown/not assessed) Reason for Treatment:  (unknown/not assessed) Does patient have an ACCT team?: No Does patient have Intensive In-House Services?  :  No Does patient have [facility] services? : No Does patient have P4CC services?: No  Alcohol Screening: Patient refused Alcohol Screening Tool: Yes 1. How often do you have a drink containing alcohol?: 4 or more times a week 2. How many drinks containing alcohol do you have on a typical day when you are drinking?: 3 or 4 3. How often do you have six or more drinks on one occasion?: Less than  monthly Preliminary Score: 2 4. How often during the last year have you found that you were not able to stop drinking once you had started?: Weekly 5. How often during the last year have you failed to do what was normally expected from you becasue of drinking?: Weekly 6. How often during the last year have you needed a first drink in the morning to get yourself going after a heavy drinking session?: Less than monthly 7. How often during the last year have you had a feeling of guilt of remorse after drinking?: Monthly 8. How often during the last year have you been unable to remember what happened the night before because you had been drinking?: Monthly 9. Have you or someone else been injured as a result of your drinking?: No 10. Has a relative or friend or a doctor or another health worker been concerned about your drinking or suggested you cut down?: Yes, during the last year Alcohol Use Disorder Identification Test Final Score (AUDIT): 21 Brief Intervention: Patient declined brief intervention Substance Abuse History in the last 12 months:  Yes.   Consequences of Substance Abuse: Legal Consequences:  reviewed Family Consequences:  reviewed Previous Psychotropic Medications: Yes Prozac Psychological Evaluations: No  Past Medical History: History reviewed. No pertinent past medical history. History reviewed. No pertinent surgical history. Family History: History reviewed. No pertinent family history. Family Psychiatric  History: Dad - alcoholism, Cousin - suicide Tobacco Screening: Have you used any form of tobacco in the last 30 days? (Cigarettes, Smokeless Tobacco, Cigars, and/or Pipes): Yes Tobacco use, Select all that apply: 5 or more cigarettes per day Are you interested in Tobacco Cessation Medications?: Yes, will notify MD for an order Counseled patient on smoking cessation including recognizing danger situations, developing coping skills and basic information about quitting provided:  Refused/Declined practical counseling Social History:  History  Alcohol Use  . 10.8 oz/week  . 18 Cans of beer per week     History  Drug Use  . Types: Marijuana    Additional Social History: Marital status: Long term relationship Divorced, when?: "several years, probably 68" Long term relationship, how long?: 17yr What types of issues is patient dealing with in the relationship?: girlfriend is homeless Does patient have children?: No    Pain Medications: denies (hx of opioid addiction, currently prescribed subutex) Prescriptions: denies (has prescription medications, no history of abusing them) Over the Counter: denies (denies) History of alcohol / drug use?: Yes Longest period of sobriety (when/how long):  (unknown) Negative Consequences of Use: Financial Withdrawal Symptoms:  (none reported) Name of Substance 1: alcohol (alcohol) 1 - Age of First Use: unknown 1 - Amount (size/oz): 2-3 beers 1 - Frequency: daily 1 - Duration: 4 months 1 - Last Use / Amount: today                  Allergies:  No Known Allergies Lab Results: No results found for this or any previous visit (from the past 48 hour(s)).  Blood Alcohol level:  No results found for: [facility] Metabolic  Disorder Labs:  No results found for: HGBA1C, MPG No results found for: PROLACTIN No results found for: CHOL, TRIG, HDL, CHOLHDL, VLDL, LDLCALC  Current Medications: Current Facility-Administered Medications  Medication Dose Route Frequency Provider Last Rate Last Dose  . acetaminophen (TYLENOL) tablet 650 mg  650 mg Oral Q6H PRN Okonkwo, Justina A, NP   650 mg at 03/10/17 1956  . alum & mag hydroxide-simeth (MAALOX/MYLANTA) 200-200-20 MG/5ML suspension 30 mL  30 mL Oral Q4H PRN Okonkwo, Justina A, NP      . DULoxetine (CYMBALTA) DR capsule 30 mg  30 mg Oral Daily Money, Darnelle Maffucci B, FNP   30 mg at 03/11/17 1214  . gabapentin (NEURONTIN) capsule 300 mg  300 mg Oral TID Money, Lowry Ram, FNP   300 mg at  03/11/17 1214  . hydrOXYzine (ATARAX/VISTARIL) tablet 25 mg  25 mg Oral TID PRN Hughie Closs A, NP   25 mg at 03/11/17 0835  . lisinopril (PRINIVIL,ZESTRIL) tablet 10 mg  10 mg Oral Daily Money, Lowry Ram, FNP   10 mg at 03/11/17 1214  . magnesium hydroxide (MILK OF MAGNESIA) suspension 30 mL  30 mL Oral Daily PRN Okonkwo, Justina A, NP      . nicotine (NICODERM CQ - dosed in mg/24 hours) patch 21 mg  21 mg Transdermal Daily Arfeen, Arlyce Harman, MD   21 mg at 03/11/17 0836  . traZODone (DESYREL) tablet 50 mg  50 mg Oral QHS PRN Lu Duffel, Justina A, NP   50 mg at 03/10/17 2134   PTA Medications: Prescriptions Prior to Admission  Medication Sig Dispense Refill Last Dose  . Buprenorphine HCl-Naloxone HCl (ZUBSOLV) 5.7-1.4 MG SUBL Place 1 tablet under the tongue 2 (two) times daily.   Past Month at Unknown time  . buPROPion (WELLBUTRIN XL) 150 MG 24 hr tablet Take 150 mg by mouth daily.   Past Month at Unknown time  . cloNIDine (CATAPRES) 0.1 MG tablet Take 0.1 mg by mouth 3 (three) times daily.   Past Month at Unknown time  . FLUoxetine (PROZAC) 20 MG capsule Take 20 mg by mouth daily.   Past Month at Unknown time  . gabapentin (NEURONTIN) 800 MG tablet Take 800 mg by mouth 3 (three) times daily.   Past Month at Unknown time    Musculoskeletal: Strength & Muscle Tone: within normal limits Gait & Station: normal Patient leans: N/A  Psychiatric Specialty Exam: Physical Exam  Nursing note and vitals reviewed. Constitutional: He is oriented to person, place, and time. He appears well-developed and well-nourished.  Respiratory: Effort normal.  Musculoskeletal: Normal range of motion.  Neurological: He is alert and oriented to person, place, and time.  Skin: Skin is warm.    Review of Systems  Constitutional: Negative.   HENT: Negative.   Eyes: Negative.   Respiratory: Negative.   Cardiovascular: Negative.   Gastrointestinal: Negative.   Genitourinary: Negative.   Musculoskeletal:  Negative.   Skin: Negative.   Neurological:       Reports nerve pain that radiates out his arms and legs  Endo/Heme/Allergies: Negative.     Blood pressure (!) 142/90, pulse (!) 106, temperature 97.7 F (36.5 C), temperature source Oral, resp. rate 16, height 6' 2"  (1.88 m), weight 96.4 kg (212 lb 8 oz), SpO2 100 %.Body mass index is 27.28 kg/m.  General Appearance: Disheveled  Eye Contact:  Good  Speech:  Clear and Coherent and Normal Rate  Volume:  Normal  Mood:  Euthymic  Affect:  Appropriate  Thought Process:  Coherent and Descriptions of Associations: Intact  Orientation:  Full (Time, Place, and Person)  Thought Content:  WDL  Suicidal Thoughts:  No  Homicidal Thoughts:  No  Memory:  Immediate;   Good Recent;   Good  Judgement:  Fair  Insight:  Fair  Psychomotor Activity:  Normal  Concentration:  Concentration: Good and Attention Span: Good  Recall:  Good  Fund of Knowledge:  Good  Language:  Good  Akathisia:  No  Handed:  Right  AIMS (if indicated):     Assets:  Desire for Improvement Social Support  ADL's:  Intact  Cognition:  WNL  Sleep:  Number of Hours: 5.25    Treatment Plan Summary: Daily contact with patient to assess and evaluate symptoms and progress in treatment, Medication management and Plan is to:  -Start Cymbalta 30 mg PO Daily with intent to titrate to 60 mg  For depression, anxiety, and pain -Start Neurontin 300 mg PO TID for nerve pain -Start Lisinopril 10 mg PO Daily for HTN -Encourage group therapy participation  Observation Level/Precautions:  15 minute checks  Laboratory:  Reviewed  Psychotherapy:  Group therapy  Medications:  See [facility]  Consultations:  As needed  Discharge Concerns:  Housing  Estimated LOS: 3-5 days  Other:  Admit to [facility] for Primary Diagnosis: MDD (major depressive disorder), recurrent severe, without psychosis ([place]) Long Term Goal(s): Improvement in symptoms so as ready for  discharge  Short Term Goals: Ability to verbalize feelings will improve and Ability to disclose and discuss suicidal ideas  Physician Treatment Plan for Secondary Diagnosis: Principal Problem:   MDD (major depressive disorder), recurrent severe, without psychosis ([place])  Long Term Goal(s): Improvement in symptoms so as ready for discharge  Short Term Goals: Ability to maintain clinical measurements within normal limits will improve and Compliance with prescribed medications will improve  I certify that inpatient services furnished can reasonably be expected to improve the patient's condition.    Lewis Shock, FNP 9/10/20181:37 PM   I have discussed case with NP and have met with patient Agree with NP assessment Patient is a 46 year old male, who presented to ED voluntarily due to depression, suicidal ideations. He reports neuro-vegetative symptoms of depression- decreased energy, poor sleep, mild anhedonia.  Denies history of psychosis. He attributes his depression partially to chronic pain, and also to significant psychosocial stressors ( recent relocation and currently homeless).   States he has been drinking 2-3 beers per day over the last several days, but denies history of alcohol abuse   He reports prior history of Bipolar Disorder. History of several prior psychiatric admissions, most recently several months ago. He states he attempted suicide 15 years ago by cutting wrist . Denies history of psychosis.  He reports chronic pain. In [place] had been prescribed Suboxone for pain . States he has arthritis on lower back, and has had hip replacement.   Denies alcohol abuse, reports prior history of taking more opiates than prescribed . Also reports cannabis use 1-2 x week.  Dx- Bipolar Disorder, Depressed versus Depression secondary to medical illness ( Chronic Pain)    Plan- Inpatient admission He has been started on Cymbalta 30 mgrs QDAY and on Neurontin 300 mgrs  TID LFTs were elevated in July- will repeat and also obtain TSH

## 2017-03-11 NOTE — Progress Notes (Signed)
D: Pt denies HI/AVH, passive SI-contracts for safety. Pt is pleasant and cooperative. Pt has been very active with peers on the unit .  A: Pt was offered support and encouragement. Pt was given scheduled medications. Pt was encourage to attend groups. Q 15 minute checks were done for safety.   R:Pt attends groups and interacts well with peers and staff. Pt is taking medication. Pt has no complaints at this time .Pt receptive to treatment and safety maintained on unit.
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
## 2017-03-11 NOTE — Progress Notes (Signed)
Recreation Therapy Notes  Date: 03/11/17 Time: 0930 Location: 400 Hall Dayroom  Group Topic: Stress Management  Goal Area(s) Addresses:  Patient will verbalize importance of using healthy stress management.  Patient will identify positive emotions associated with healthy stress management.   Behavioral Response: Engaged  Intervention: Stress Management  Activity :  Meditation.   LRT introduced the stress management technique of meditation.  LRT played a meditation from the Calm app that focused on scanning the body.  Patients were to focus on and make not of whatever sensations they were feeling.  Education:  Stress Management, Discharge Planning.   Education Outcome: Acknowledges edcuation/In group clarification offered/Needs additional education  Clinical Observations/Feedback: Pt attended group.   Macyn Remmert, LRT/CTRS         Mishika Flippen A 03/11/2017 12:01 PM 

## 2017-03-12 LAB — HEPATIC FUNCTION PANEL
ALT: 18 U/L (ref 17–63)
AST: 42 U/L — ABNORMAL HIGH (ref 15–41)
Albumin: 4.1 g/dL (ref 3.5–5.0)
Alkaline Phosphatase: 98 U/L (ref 38–126)
Bilirubin, Direct: 0.1 mg/dL (ref 0.1–0.5)
Indirect Bilirubin: 0.8 mg/dL (ref 0.3–0.9)
Total Bilirubin: 0.9 mg/dL (ref 0.3–1.2)
Total Protein: 6.8 g/dL (ref 6.5–8.1)

## 2017-03-12 LAB — TSH: TSH: 2.274 u[IU]/mL (ref 0.350–4.500)

## 2017-03-12 MED ORDER — GABAPENTIN 400 MG PO CAPS
400.0000 mg | ORAL_CAPSULE | Freq: Three times a day (TID) | ORAL | Status: DC
  Administered 2017-03-12 – 2017-03-14 (×5): 400 mg via ORAL
  Filled 2017-03-12 (×12): qty 1

## 2017-03-12 MED ORDER — DULOXETINE HCL 20 MG PO CPEP
40.0000 mg | ORAL_CAPSULE | Freq: Every day | ORAL | Status: DC
Start: 2017-03-13 — End: 2017-03-13
  Administered 2017-03-13: 40 mg via ORAL
  Filled 2017-03-12 (×2): qty 2

## 2017-03-12 NOTE — Progress Notes (Signed)
D: Patient has been isolative and withdrawn today.  He reports chronic shoulder and hip pain.  He reports fair sleep; good appetite; high energy and poor concentration.  He reports passive thoughts of self harm without a specific plan.  Patient has been encouraged to attend groups with little progress.  Patient has to be prompted several times to come up to medication window.  Patient given ibuprofen for pain.  His mood is pleasant; his affect is blunted.   A: Continue to monitor medication management and MD orders.  Safety checks completed every 15 minutes per protocol. Offer support and encouragement as needed. R: Patient has minimal interaction with peers and staff.

## 2017-03-12 NOTE — Progress Notes (Signed)
Adult Psychoeducational Group Note  Date:  03/12/2017 Time:  4:36 AM  Group Topic/Focus:  Wrap-Up Group:   The focus of this group is to help patients review their daily goal of treatment and discuss progress on daily workbooks.  Participation Level:  Active  Participation Quality:  Appropriate  Affect:  Appropriate  Cognitive:  Appropriate  Insight: Appropriate  Engagement in Group:  Engaged  Modes of Intervention:  Discussion  Additional Comments:  Pt stated his goal for today was to talk with doctor about his medication, so he could get some rest. Pt rated his day and 8 out of 10. Pt stated he attended all groups today.  Felipa FurnaceChristopher  Meghna Hagmann 03/12/2017, 4:36 AM

## 2017-03-12 NOTE — Progress Notes (Signed)
[facility] MD Progress Note  03/12/2017 8:37 AM Dominic Spencer  MRN:  [id number] Subjective: patient reports that today he is feeling some increased depression and vague irritability which he attributes to increased L hip pain today. States hip pain is chronic and on some days hurts more than others without any clear reason why.  Denies medication side effects and does feel current regimen is " helping a little ". Reports some passive suicidal ideations, but denies any actual plan or intention of hurting self and contracts for safety on unit .    Objective : I have discussed case with treatment team and have met with patient. Patient presents vaguely depressed and dysphoric. States he is experiencing increased hip pain today. Does not appear to be in any acute distress.  Denies medication side effects, and expresses a sense of hope and optimism that medications will help. Although endorses some ongoing passive SI, he is future oriented, and states he is working on looking into [employer] as plan for discharge. Labs- repeat LFTs much improved compared to prior. TSH WNL.  No disruptive or agitated behaviors on unit, going to some groups.   Principal Problem: MDD (major depressive disorder), recurrent severe, without psychosis ([place]) Diagnosis:   Patient Active Problem List   Diagnosis Date Noted  . MDD (major depressive disorder), recurrent severe, without psychosis ([place]) [F33.2] 03/10/2017   Total Time spent with patient: 20 minutes   Past Medical History: History reviewed. No pertinent past medical history. History reviewed. No pertinent surgical history. Family History: History reviewed. No pertinent family history.  Social History:  History  Alcohol Use  . 10.8 oz/week  . 18 Cans of beer per week     History  Drug Use  . Types: Marijuana    Social History   Social History  . Marital status: Single    Spouse name: N/A  . Number of children: N/A  . Years of education: N/A    Social History Main Topics  . Smoking status: Heavy Tobacco Smoker    Packs/day: 1.50    Types: Cigarettes  . Smokeless tobacco: Never Used  . Alcohol use 10.8 oz/week    18 Cans of beer per week  . Drug use: Yes    Types: Marijuana  . Sexual activity: Yes    Birth control/ protection: None   Other Topics Concern  . None   Social History Narrative  . None   Additional Social History:    Pain Medications: denies (hx of opioid addiction, currently prescribed subutex) Prescriptions: denies (has prescription medications, no history of abusing them) Over the Counter: denies (denies) History of alcohol / drug use?: Yes Longest period of sobriety (when/how long):  (unknown) Negative Consequences of Use: Financial Withdrawal Symptoms:  (none reported) Name of Substance 1: alcohol (alcohol) 1 - Age of First Use: unknown 1 - Amount (size/oz): 2-3 beers 1 - Frequency: daily 1 - Duration: 4 months 1 - Last Use / Amount: today  Sleep: Fair  Appetite:  Fair  Current Medications: Current Facility-Administered Medications  Medication Dose Route Frequency Provider Last Rate Last Dose  . alum & mag hydroxide-simeth (MAALOX/MYLANTA) 200-200-20 MG/5ML suspension 30 mL  30 mL Oral Q4H PRN Okonkwo, Justina A, NP      . DULoxetine (CYMBALTA) DR capsule 30 mg  30 mg Oral Daily Money, Lowry Ram, FNP   30 mg at 03/12/17 0810  . gabapentin (NEURONTIN) capsule 300 mg  300 mg Oral TID Money, Lowry Ram, FNP  300 mg at 03/12/17 0810  . hydrOXYzine (ATARAX/VISTARIL) tablet 25 mg  25 mg Oral TID PRN Hughie Closs A, NP   25 mg at 03/11/17 2045  . ibuprofen (ADVIL,MOTRIN) tablet 400 mg  400 mg Oral Q6H PRN Josedejesus Marcum, Myer Peer, MD   400 mg at 03/11/17 1635  . lisinopril (PRINIVIL,ZESTRIL) tablet 10 mg  10 mg Oral Daily Money, Lowry Ram, FNP   10 mg at 03/12/17 0810  . magnesium hydroxide (MILK OF MAGNESIA) suspension 30 mL  30 mL Oral Daily PRN Okonkwo, Justina A, NP      . nicotine (NICODERM CQ -  dosed in mg/24 hours) patch 21 mg  21 mg Transdermal Daily Arfeen, Arlyce Harman, MD   21 mg at 03/12/17 0811  . traZODone (DESYREL) tablet 50 mg  50 mg Oral QHS PRN Lu Duffel, Justina A, NP   50 mg at 03/11/17 2227    Lab Results:  Results for orders placed or performed during the hospital encounter of 03/10/17 (from the past 48 hour(s))  Hepatic function panel     Status: Abnormal   Collection Time: 03/12/17  6:20 AM  Result Value Ref Range   Total Protein 6.8 6.5 - 8.1 g/dL   Albumin 4.1 3.5 - 5.0 g/dL   AST 42 (H) 15 - 41 U/L   ALT 18 17 - 63 U/L   Alkaline Phosphatase 98 38 - 126 U/L   Total Bilirubin 0.9 0.3 - 1.2 mg/dL   Bilirubin, Direct 0.1 0.1 - 0.5 mg/dL   Indirect Bilirubin 0.8 0.3 - 0.9 mg/dL    Comment: Performed at [facility], [place] [address] [place], [place] [address]  TSH     Status: None   Collection Time: 03/12/17  6:20 AM  Result Value Ref Range   TSH 2.274 0.350 - 4.500 uIU/mL    Comment: Performed by a 3rd Generation assay with a functional sensitivity of <=0.01 uIU/mL. Performed at [facility], [place] [address], [place], [place] [address]     Blood Alcohol level:  No results found for: [facility]  Metabolic Disorder Labs: No results found for: HGBA1C, MPG No results found for: PROLACTIN No results found for: CHOL, TRIG, HDL, CHOLHDL, VLDL, LDLCALC  Physical Findings: AIMS:  , ,  ,  ,    CIWA:    COWS:     Musculoskeletal: Strength & Muscle Tone: within normal limits Gait & Station: normal Patient leans: N/A  Psychiatric Specialty Exam: Physical Exam  ROS denies chest pain, no shortness of breath, no vomiting   Blood pressure 114/77, pulse 91, temperature 98.9 F (37.2 C), temperature source Oral, resp. rate 16, height 6' 2"  (1.88 m), weight 96.4 kg (212 lb 8 oz), SpO2 100 %.Body mass index is 27.28 kg/m.  General Appearance: Fairly Groomed  Eye Contact:  Good  Speech:  Normal Rate  Volume:  Normal  Mood:  depressed ,  vaguely dysphoric  Affect:  constricted , but smiles briefly at times   Thought Process:  Linear and Descriptions of Associations: Intact  Orientation:  Full (Time, Place, and Person)  Thought Content:  denies hallucinations, no delusions, not internally preoccupied   Suicidal Thoughts:  Yes.  without intent/plan, denies active suicidal or self injurious ideations   Homicidal Thoughts:  No denies homicidal ideations  Memory:  recent and remote grossly intact   Judgement:  Fair- improving  Insight:  Fair- improving   Psychomotor Activity:  Normal  Concentration:  Concentration: Good and  Attention Span: Good  Recall:  Good  Fund of Knowledge:  Good  Language:  Good  Akathisia:  Negative  Handed:  Right  AIMS (if indicated):     Assets:  Communication Skills Desire for Improvement Resilience  ADL's:  Intact  Cognition:  WNL  Sleep:  Number of Hours: 5.5   Assessment - patient reports some increased depression and vague sense of irritability today which he attributes to increased hip pain today ( pain is chronic but tends to wax and wane ). He describes lingering passive SI, but denies suicidal plan or intention, contracts for safety on unit. Thus far tolerating medications well .  Treatment Plan Summary: Daily contact with patient to assess and evaluate symptoms and progress in treatment, Medication management, Plan inpatient treatment  and medications as below Encourage group and milieu participation to work on coping skills and symptom reduction Increase Cymbalta to 40 mgrs QDAY for depression ,anxiety, pain Increase Neurontin to 400 mgrs TID to address pain, anxiety Continue Trazodone 50 mgrs QHS PRN for insomnia as needed  Continue Vistaril 25 mgrs Q 8 hours PRN for anxiety as needed  Treatment team working on disposition planning options. Jenne Campus, MD 03/12/2017, 8:37 AM

## 2017-03-12 NOTE — BHH Group Notes (Signed)
LCSW Group Therapy Note  03/12/2017 1:15pm  Type of Therapy/Topic:  Group Therapy:  Feelings about Diagnosis  Participation Level:  Did Not Attend   Description of Group:   This group will allow patients to explore their thoughts and feelings about diagnoses they have received. Patients will be guided to explore their level of understanding and acceptance of these diagnoses. Facilitator will encourage patients to process their thoughts and feelings about the reactions of others to their diagnosis and will guide patients in identifying ways to discuss their diagnosis with significant others in their lives. This group will be process-oriented, with patients participating in exploration of their own experiences, giving and receiving support, and processing challenge from other group members.   Therapeutic Goals: 1. Patient will demonstrate understanding of diagnosis as evidenced by identifying two or more symptoms of the disorder 2. Patient will be able to express two feelings regarding the diagnosis 3. Patient will demonstrate their ability to communicate their needs through discussion and/or role play    Therapeutic Modalities:   Cognitive Behavioral Therapy Brief Therapy Feelings Identification    Verdene LennertLauren C Mirha Brucato, LCSW 03/12/2017 3:29 PM

## 2017-03-13 MED ORDER — DULOXETINE HCL 60 MG PO CPEP
60.0000 mg | ORAL_CAPSULE | Freq: Every day | ORAL | Status: DC
  Administered 2017-03-14 – 2017-03-19 (×6): 60 mg via ORAL
  Filled 2017-03-13 (×2): qty 1
  Filled 2017-03-13: qty 7
  Filled 2017-03-13 (×3): qty 1
  Filled 2017-03-13: qty 7
  Filled 2017-03-13 (×2): qty 1

## 2017-03-13 NOTE — [facility] Group Notes (Signed)
[facility] 03/13/2017 1:15pm  Type of Therapy: Mental Health Association Presentation  Participation Level: Active  Participation Quality: Attentive  Affect: Appropriate  Cognitive: Oriented  Insight: Developing/Improving  Engagement in Therapy: Engaged  Modes of Intervention: Discussion, Education and Socialization  Summary of Progress/Problems: Mental Health Association (MHA) Speaker came to talk about his personal journey with mental health. The pt processed ways by which to relate to the speaker. MHA speaker provided handouts and educational information pertaining to groups and services offered by [facility]. Pt was engaged in speaker's presentation and was receptive to resources provided.    Verdene LennertLauren C Eluterio Seymour, LCSW 03/13/2017 3:40 PM

## 2017-03-13 NOTE — Progress Notes (Signed)
Dominic Spencer. Dominic Spencer had been up and visible in milieu this evening, did attend and participate in group activity. He was seen interacting appropriately with peers. He did endorse some depression and anxiety and was able to receive all medications without incident this evening. A. Support and encouragement provided. R. Safety maintained, will continue to monitor.

## 2017-03-13 NOTE — Progress Notes (Signed)
Adult Psychoeducational Group Note  Date:  03/13/2017 Time:  4:27 AM  Group Topic/Focus:  Wrap-Up Group:   The focus of this group is to help patients review their daily goal of treatment and discuss progress on daily workbooks.  Participation Level:  Active  Participation Quality:  Appropriate  Affect:  Appropriate  Cognitive:  Appropriate  Insight: Appropriate  Engagement in Group:  Engaged  Modes of Intervention:  Discussion  Additional Comments:  Pt stated his goal for today was to get some rest. Pt stated he was able to accomplish his goal today. Pt rated his over all day a 4 out of 10. Pt stated he attended all groups held today.   Felipa FurnaceChristopher  Gale Klar 03/13/2017, 4:27 AM

## 2017-03-13 NOTE — Tx Team (Signed)
Interdisciplinary Treatment and Diagnostic Plan Update  03/13/2017 Time of Session: 9:30am Dominic Spencer MRN: [id number]  Principal Diagnosis: MDD (major depressive disorder), recurrent severe, without psychosis (HCC)  Secondary Diagnoses: Principal Problem:   MDD (major depressive disorder), recurrent severe, without psychosis (HCC)   Current Medications:  Current Facility-Administered Medications  Medication Dose Route Frequency Provider Last Rate Last Dose  . alum & mag hydroxide-simeth (MAALOX/MYLANTA) 200-200-20 MG/5ML suspension 30 mL  30 mL Oral Q4H PRN Okonkwo, Justina A, NP      . DULoxetine (CYMBALTA) DR capsule 40 mg  40 mg Oral Daily Cobos, Rockey Situ, MD   40 mg at 03/13/17 0805  . gabapentin (NEURONTIN) capsule 400 mg  400 mg Oral TID Cobos, Rockey Situ, MD   400 mg at 03/13/17 0805  . hydrOXYzine (ATARAX/VISTARIL) tablet 25 mg  25 mg Oral TID PRN Ferne Reus A, NP   25 mg at 03/12/17 2136  . ibuprofen (ADVIL,MOTRIN) tablet 400 mg  400 mg Oral Q6H PRN Cobos, Rockey Situ, MD   400 mg at 03/12/17 1422  . lisinopril (PRINIVIL,ZESTRIL) tablet 10 mg  10 mg Oral Daily Money, Gerlene Burdock, FNP   10 mg at 03/13/17 0805  . magnesium hydroxide (MILK OF MAGNESIA) suspension 30 mL  30 mL Oral Daily PRN Okonkwo, Justina A, NP      . nicotine (NICODERM CQ - dosed in mg/24 hours) patch 21 mg  21 mg Transdermal Daily Arfeen, Phillips Grout, MD   21 mg at 03/13/17 0800  . traZODone (DESYREL) tablet 50 mg  50 mg Oral QHS PRN Beryle Lathe, Justina A, NP   50 mg at 03/12/17 2136    PTA Medications: Prescriptions Prior to Admission  Medication Sig Dispense Refill Last Dose  . Buprenorphine HCl-Naloxone HCl (ZUBSOLV) 5.7-1.4 MG SUBL Place 1 tablet under the tongue 2 (two) times daily.   Past Month at Unknown time  . buPROPion (WELLBUTRIN XL) 150 MG 24 hr tablet Take 150 mg by mouth daily.   Past Month at Unknown time  . cloNIDine (CATAPRES) 0.1 MG tablet Take 0.1 mg by mouth 3 (three) times daily.   Past  Month at Unknown time  . FLUoxetine (PROZAC) 20 MG capsule Take 20 mg by mouth daily.   Past Month at Unknown time  . gabapentin (NEURONTIN) 800 MG tablet Take 800 mg by mouth 3 (three) times daily.   Past Month at Unknown time    Treatment Modalities: Medication Management, Group therapy, Case management,  1 to 1 session with clinician, Psychoeducation, Recreational therapy.  Patient Stressors: Loss of housing and job.  Patient Strengths: Motivation for treatment/growth  Physician Treatment Plan for Primary Diagnosis: MDD (major depressive disorder), recurrent severe, without psychosis (HCC) Long Term Goal(s): Improvement in symptoms so as ready for discharge  Short Term Goals: Ability to verbalize feelings will improve Ability to disclose and discuss suicidal ideas Ability to maintain clinical measurements within normal limits will improve Compliance with prescribed medications will improve  Medication Management: Evaluate patient's response, side effects, and tolerance of medication regimen.  Therapeutic Interventions: 1 to 1 sessions, Unit Group sessions and Medication administration.  Evaluation of Outcomes: Progressing  Physician Treatment Plan for Secondary Diagnosis: Principal Problem:   MDD (major depressive disorder), recurrent severe, without psychosis (HCC)   Long Term Goal(s): Improvement in symptoms so as ready for discharge  Short Term Goals: Ability to verbalize feelings will improve Ability to disclose and discuss suicidal ideas Ability to maintain clinical measurements within normal limits will  improve Compliance with prescribed medications will improve  Medication Management: Evaluate patient's response, side effects, and tolerance of medication regimen.  Therapeutic Interventions: 1 to 1 sessions, Unit Group sessions and Medication administration.  Evaluation of Outcomes: Progressing   RN Treatment Plan for Primary Diagnosis: MDD (major depressive  disorder), recurrent severe, without psychosis (HCC) Long Term Goal(s): Knowledge of disease and therapeutic regimen to maintain health will improve  Short Term Goals: Ability to disclose and discuss suicidal ideas, Ability to identify and develop effective coping behaviors will improve and Compliance with prescribed medications will improve  Medication Management: RN will administer medications as ordered by provider, will assess and evaluate patient's response and provide education to patient for prescribed medication. RN will report any adverse and/or side effects to prescribing provider.  Therapeutic Interventions: 1 on 1 counseling sessions, Psychoeducation, Medication administration, Evaluate responses to treatment, Monitor vital signs and CBGs as ordered, Perform/monitor CIWA, COWS, AIMS and Fall Risk screenings as ordered, Perform wound care treatments as ordered.  Evaluation of Outcomes: Progressing   LCSW Treatment Plan for Primary Diagnosis: MDD (major depressive disorder), recurrent severe, without psychosis (HCC) Long Term Goal(s): Safe transition to appropriate next level of care at discharge, Engage patient in therapeutic group addressing interpersonal concerns.  Short Term Goals: Engage patient in aftercare planning with referrals and resources, Identify triggers associated with mental health/substance abuse issues and Increase skills for wellness and recovery  Therapeutic Interventions: Assess for all discharge needs, 1 to 1 time with Social worker, Explore available resources and support systems, Assess for adequacy in community support network, Educate family and significant other(s) on suicide prevention, Complete Psychosocial Assessment, Interpersonal group therapy.  Evaluation of Outcomes: Progressing   Progress in Treatment: Attending groups: Intermittently  Participating in groups: Intermittently Taking medication as prescribed: Yes, MD continues to assess for  medication changes as needed Toleration medication: Yes, no side effects reported at this time Family/Significant other contact made: No, Pt declines Patient understands diagnosis: Continuing to assess Discussing patient identified problems/goals with staff: Yes Medical problems stabilized or resolved: Yes Denies suicidal/homicidal ideation: No, endorses passive SI Issues/concerns per patient self-inventory: None Other: N/A  New problem(s) identified: None identified at this time.   New Short Term/Long Term Goal(s): "get housing and get on my feet"  Discharge Plan or Barriers: Pt plans to discharge to an [facility] until he can save enough for his own apartment.   Reason for Continuation of Hospitalization: Depression Medication stabilization Suicidal ideation  Estimated Length of Stay: 1-3 days; est DC date 9/13  Attendees: Patient:  03/13/2017  10:21 AM  Physician: Dr. Jama Flavorsobos, MD 03/13/2017  10:21 AM  Nursing: Armando ReichertMarian, RN 03/13/2017  10:21 AM  RN Care Manager: Onnie BoerJennifer Clark, RN 03/13/2017  10:21 AM  Social Worker: Vernie ShanksLauren Bralyn Folkert, LCSW 03/13/2017  10:21 AM  Recreational Therapist:  03/13/2017  10:21 AM  Other: Armandina StammerAgnes Nwoko, NP; Reola Calkinsravis Money, NP 03/13/2017  10:21 AM  Other:  03/13/2017  10:21 AM  Other: 03/13/2017  10:21 AM    Scribe for Treatment Team: Verdene LennertLauren C Giah Fickett, LCSW 03/13/2017 10:21 AM

## 2017-03-13 NOTE — Progress Notes (Signed)
Adult Psychoeducational Group Note  Date:  03/13/2017 Time:  9:50 PM  Group Topic/Focus:  Wrap-Up Group:   The focus of this group is to help patients review their daily goal of treatment and discuss progress on daily workbooks.  Participation Level:  Active  Participation Quality:  Appropriate  Affect:  Appropriate  Cognitive:  Alert  Insight: Appropriate  Engagement in Group:  Engaged  Modes of Intervention:  Discussion  Additional Comments:  Pt rated his day 4/10. Pt stated his goal is to make positive changes in his life.   Kaleen OdeaCOOKE, Trinka Keshishyan R 03/13/2017, 9:50 PM

## 2017-03-13 NOTE — Progress Notes (Signed)
D: Pt presents with a flat affect and depressed mood. Pt rates depression 5/10. Anxiety 5/10. Hopelessness 4/10. Pt endorses suicidal thoughts with no plan or intent. Pt verbally contracts for safety. Pt reports fair sleep. Pt reports fair appetite. Pt stated that he' seeking long-term tx for substance abuse. Pt noted to have minimal interaction on the unit today. Pt refused to come to the med window to take afternoon meds. Pt encouraged several times to come take noon meds. Pt refused. A: Medications reviewed with pt. Medications administered as ordered per MD. Verbal support provided. Pt encouraged to attend groups. 15 minute checks performed for safety.  R: Pt verbalizes understanding of tx plan.

## 2017-03-13 NOTE — Progress Notes (Signed)
Recreation Therapy Notes  Date: 03/13/17 Time: 0930 Location: 300 Hall Dayroom  Group Topic: Stress Management  Goal Area(s) Addresses:  Patient will verbalize importance of using healthy stress management.  Patient will identify positive emotions associated with healthy stress management.   Intervention: Stress Management  Activity :  Guided Imagery.  LRT introduced the stress management technique of guided imagery.  LRT read a script to allow patients to participate in the activity.  Patients were to follow along as LRT read the script to engage in the activity.  Education:  Stress Management, Discharge Planning.   Education Outcome: Acknowledges edcuation/In group clarification offered/Needs additional education  Clinical Observations/Feedback: Pt did not attend group.   Dominic Spencer, LRT/CTRS         Laurali Goddard A 03/13/2017 12:09 PM 
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
## 2017-03-14 MED ORDER — GABAPENTIN 300 MG PO CAPS
600.0000 mg | ORAL_CAPSULE | Freq: Three times a day (TID) | ORAL | Status: DC
  Administered 2017-03-14 – 2017-03-19 (×15): 600 mg via ORAL
  Filled 2017-03-14 (×9): qty 2
  Filled 2017-03-14: qty 42
  Filled 2017-03-14 (×2): qty 2
  Filled 2017-03-14: qty 42
  Filled 2017-03-14 (×2): qty 2
  Filled 2017-03-14 (×2): qty 42
  Filled 2017-03-14 (×3): qty 2
  Filled 2017-03-14 (×2): qty 42
  Filled 2017-03-14: qty 2

## 2017-03-14 NOTE — Progress Notes (Signed)
Dominic Spencer. Jaydrien had been up and visible in milieu this evening, did attend and participate in evening group activity. He spoke of his day and spoke about not having a good day, He has appeared withdrawn and depressed this evening and did endorse anxiety. He did request and receive medications without incident this evening. A. Support and encouragement provided. R. Safety maintained, will continue to monitor.

## 2017-03-14 NOTE — [facility] Group Notes (Signed)
LCSW Group Therapy Note  03/14/2017 1:15pm  Type of Therapy/Topic:  Group Therapy:  Balance in Life  Participation Level:  Did Not Attend  Description of Group:    This group will address the concept of balance and how it feels and looks when one is unbalanced. Patients will be encouraged to process areas in their lives that are out of balance and identify reasons for remaining unbalanced. Facilitators will guide patients in utilizing problem-solving interventions to address and correct the stressor making their life unbalanced. Understanding and applying boundaries will be explored and addressed for obtaining and maintaining a balanced life. Patients will be encouraged to explore ways to assertively make their unbalanced needs known to significant others in their lives, using other group members and facilitator for support and feedback.  Therapeutic Goals: 1. Patient will identify two or more emotions or situations they have that consume much of in their lives. 2. Patient will identify signs/triggers that life has become out of balance:  3. Patient will identify two ways to set boundaries in order to achieve balance in their lives:  4. Patient will demonstrate ability to communicate their needs through discussion and/or role plays    Therapeutic Modalities:   Cognitive Behavioral Therapy Solution-Focused Therapy Assertiveness Training  Verdene LennertLauren C Kinjal Neitzke, LCSW 03/14/2017 2:59 PM

## 2017-03-14 NOTE — Progress Notes (Signed)
Nursing Progress Note: 7p-7a D: Pt currently presents with a silly/pleasant affect and behavior. Pt states "I had a better day today. I slept almost all day so I feel much better. I wanna go home soon." Interacting apporpriately with the milieu. Pt reports good sleep during the previous night with current medication regimen. Pt did attend wrap-up group.  A: Pt provided with medications per providers orders. Pt's labs and vitals were monitored throughout the night. Pt supported emotionally and encouraged to express concerns and questions. Pt educated on medications.  R: Pt's safety ensured with 15 minute and environmental checks. Pt currently denies SI, HI, and AVH. Pt verbally contracts to seek staff if SI,HI, or AVH occurs and to consult with staff before acting on any harmful thoughts. Will continue to monitor.

## 2017-03-14 NOTE — Progress Notes (Signed)
D: Patient has been visible in the milieu today.  He is interacting well with staff and peers.  Patient has brighter affect.  He continues to report some passive SI, however, states it has improved.  He rates his depressive symptoms as a 5; hopelessness as a 4; anxiety as a 5.  Patient is planning on transitioning to a facility on 9/18.  Patient will be admitted to [facility] and they will have a bed and provide transportation on that date. A: Continue to monitor medication management and MD orders.  Safety checks continued every 15 minutes per protocol.  Offer support and encouragement as needed. R: Patient is receptive to staff; his behavior is appropriate.

## 2017-03-14 NOTE — Progress Notes (Signed)
Pt attend wrap group. His day was a 6. His goal to work on Pharmacologistcoping skills.

## 2017-03-14 NOTE — Progress Notes (Signed)
CSW spoke with admissions at [facility] who reports that they have a bed available at their transitional housing facility on Tuesday 9/18- they will provide transportation.   Vernie ShanksLauren Semaj Coburn, LCSW Clinical Social Work [phone]

## 2017-03-14 NOTE — Progress Notes (Signed)
[facility] MD Progress Note  03/14/2017 11:43 AM Dominic Spencer  MRN:  [id number] Subjective: patient describes limited improvement compared to admission , states " maybe I am feeling a little better", but continues to endorse feeling depressed, anxious, and continues to endorse intermittent passive SI. He is future oriented, and states he is hoping to be able to go to [facility] rehab at discharge.  Denies medication side effects.  Objective : I have discussed case with treatment team and have met with patient. Patient is presenting with constricted affect, and reports ongoing sense of sadness and passive SI, although contracts for safety on unit, and denies any plan or intention of hurting self .  He has been more visible on milieu, but as per staff report has presented withdrawn and depressed . Little interaction with peers . He spends most time in his room. Continues to report feeling overwhelmed by stressors, mainly homelessness .  As above, does present future oriented, and states he continues to want to go to [facility] on discharge. Denies medication side effects.      Principal Problem: MDD (major depressive disorder), recurrent severe, without psychosis ([place]) Diagnosis:   Patient Active Problem List   Diagnosis Date Noted  . MDD (major depressive disorder), recurrent severe, without psychosis ([place]) [F33.2] 03/10/2017   Total Time spent with patient: 20 minutes   Past Medical History: History reviewed. No pertinent past medical history. History reviewed. No pertinent surgical history. Family History: History reviewed. No pertinent family history.  Social History:  History  Alcohol Use  . 10.8 oz/week  . 18 Cans of beer per week     History  Drug Use  . Types: Marijuana    Social History   Social History  . Marital status: Single    Spouse name: N/A  . Number of children: N/A  . Years of education: N/A   Social History Main Topics  . Smoking  status: Heavy Tobacco Smoker    Packs/day: 1.50    Types: Cigarettes  . Smokeless tobacco: Never Used  . Alcohol use 10.8 oz/week    18 Cans of beer per week  . Drug use: Yes    Types: Marijuana  . Sexual activity: Yes    Birth control/ protection: None   Other Topics Concern  . None   Social History Narrative  . None   Additional Social History:    Pain Medications: denies (hx of opioid addiction, currently prescribed subutex) Prescriptions: denies (has prescription medications, no history of abusing them) Over the Counter: denies (denies) History of alcohol / drug use?: Yes Longest period of sobriety (when/how long):  (unknown) Negative Consequences of Use: Financial Withdrawal Symptoms:  (none reported) Name of Substance 1: alcohol (alcohol) 1 - Age of First Use: unknown 1 - Amount (size/oz): 2-3 beers 1 - Frequency: daily 1 - Duration: 4 months 1 - Last Use / Amount: today  Sleep: Fair  Appetite:  improving   Current Medications: Current Facility-Administered Medications  Medication Dose Route Frequency Provider Last Rate Last Dose  . alum & mag hydroxide-simeth (MAALOX/MYLANTA) 200-200-20 MG/5ML suspension 30 mL  30 mL Oral Q4H PRN Okonkwo, Justina A, NP      . DULoxetine (CYMBALTA) DR capsule 60 mg  60 mg Oral Daily Shalayah Beagley, Myer Peer, MD   60 mg at 03/14/17 0745  . gabapentin (NEURONTIN) capsule 400 mg  400 mg Oral TID Dazani Norby, Myer Peer, MD   400 mg at 03/14/17 0745  . hydrOXYzine (  ATARAX/VISTARIL) tablet 25 mg  25 mg Oral TID PRN Lu Duffel, Justina A, NP   25 mg at 03/13/17 2117  . ibuprofen (ADVIL,MOTRIN) tablet 400 mg  400 mg Oral Q6H PRN Nari Vannatter, Myer Peer, MD   400 mg at 03/12/17 1422  . lisinopril (PRINIVIL,ZESTRIL) tablet 10 mg  10 mg Oral Daily Money, Lowry Ram, FNP   10 mg at 03/14/17 0745  . magnesium hydroxide (MILK OF MAGNESIA) suspension 30 mL  30 mL Oral Daily PRN Okonkwo, Justina A, NP   30 mL at 03/14/17 0746  . nicotine (NICODERM CQ - dosed in mg/24  hours) patch 21 mg  21 mg Transdermal Daily Arfeen, Arlyce Harman, MD   21 mg at 03/14/17 0748  . traZODone (DESYREL) tablet 50 mg  50 mg Oral QHS PRN Lu Duffel, Justina A, NP   50 mg at 03/13/17 2117    Lab Results:  No results found for this or any previous visit (from the past 48 hour(s)).  Blood Alcohol level:  No results found for: [facility]  Metabolic Disorder Labs: No results found for: HGBA1C, MPG No results found for: PROLACTIN No results found for: CHOL, TRIG, HDL, CHOLHDL, VLDL, LDLCALC  Physical Findings: AIMS:  , ,  ,  ,    CIWA:    COWS:     Musculoskeletal: Strength & Muscle Tone: within normal limits Gait & Station: normal Patient leans: N/A  Psychiatric Specialty Exam: Physical Exam  ROS denies chest pain, no shortness of breath, no vomiting , chronic hip pain   Blood pressure 118/90, pulse (!) 115, temperature 98.9 F (37.2 C), temperature source Oral, resp. rate 16, height _0  (1.88 m), weight 96.4 kg (212 lb 8 oz), SpO2 100 %.Body mass index is 27.28 kg/m.  General Appearance: Fairly Groomed  Eye Contact:  Good  Speech:  Normal Rate  Volume:  Normal  Mood:  remains depressed, some improvement ,but still constricted in affect  Affect:  Constricted  Thought Process:  Linear and Descriptions of Associations: Intact  Orientation:  Full (Time, Place, and Person)  Thought Content:  denies hallucinations, no delusions, not internally preoccupied   Suicidal Thoughts:  Yes.  without intent/plan, continues to report passive thoughts of death, dying, but denies plan or intention of hurting self or of suicide and is future oriented   Homicidal Thoughts:  No denies homicidal ideations  Memory:  recent and remote grossly intact   Judgement:  Fair- improving  Insight:  Fair- improving   Psychomotor Activity:  Normal  Concentration:  Concentration: Good and Attention Span: Good  Recall:  Good  Fund of Knowledge:  Good  Language:  Good  Akathisia:  Negative  Handed:  Right   AIMS (if indicated):     Assets:  Communication Skills Desire for Improvement Resilience  ADL's:  Intact  Cognition:  WNL  Sleep:  Number of Hours: 5   Assessment - patient continues to present depressed, sad, isolative, and endorsing some passive SI, but denying any active SI and presenting future oriented, wanting to go to [facility].States he has been told he will have a bed as of Monday or Tuesday- CSW , with patient's consent, will call to clarify and solidify disposition planning. Tolerating medications well .  Treatment Plan Summary: Treatment plan reviewed as below today 9/13  Daily contact with patient to assess and evaluate symptoms and progress in treatment, Medication management, Plan inpatient treatment  and medications as below Encourage group and milieu participation to  work on Radiographer, therapeutic and symptom reduction Continue Cymbalta  60 mgrs QDAY for depression ,anxiety, pain Increase  Neurontin  To 600  mgrs TID to address chronic pain, anxiety Continue Trazodone 50 mgrs QHS PRN for insomnia as needed  Continue Vistaril 25 mgrs Q 8 hours PRN for anxiety as needed  Treatment team working on disposition planning options. Jenne Campus, MD 03/14/2017, 11:43 AM   Patient ID: Dominic Spencer, male   DOB: 1970/10/22, 46 y.o.   MRN: [id number]

## 2017-03-15 DIAGNOSIS — F1290 Cannabis use, unspecified, uncomplicated: Secondary | ICD-10-CM

## 2017-03-15 DIAGNOSIS — G8929 Other chronic pain: Secondary | ICD-10-CM

## 2017-03-15 DIAGNOSIS — R450 Nervousness: Secondary | ICD-10-CM

## 2017-03-15 DIAGNOSIS — R45851 Suicidal ideations: Secondary | ICD-10-CM

## 2017-03-15 DIAGNOSIS — G4700 Insomnia, unspecified: Secondary | ICD-10-CM

## 2017-03-15 MED ORDER — BUSPIRONE HCL 10 MG PO TABS
10.0000 mg | ORAL_TABLET | Freq: Two times a day (BID) | ORAL | Status: DC
  Administered 2017-03-15 – 2017-03-19 (×8): 10 mg via ORAL
  Filled 2017-03-15: qty 14
  Filled 2017-03-15 (×4): qty 1
  Filled 2017-03-15: qty 14
  Filled 2017-03-15 (×3): qty 1
  Filled 2017-03-15: qty 14
  Filled 2017-03-15 (×2): qty 1
  Filled 2017-03-15: qty 14
  Filled 2017-03-15: qty 1

## 2017-03-15 NOTE — Progress Notes (Signed)
[facility] MD Progress Note  03/15/2017 3:05 PM Dominic Spencer  MRN:  [id number]   Subjective: Patient reports that he is doing okay today. He also reports that he is very anxious, still depressed and he is suicidal without a plan. He agrees to either Buspar or Propanolol for his anxiety since that is the worst symptom.  Objective : Patient continues to report depression, sad, and endorsing some passive SI, but denying any active SI, but still wanting to go to [facility]. He has been seen laughing, joking, playing basketball, interacting with others appropriately, but will report all of the above. Still waiting on bed and will hopefully be available on Monday. Will prescribe Buspar 10 mg PO BID for anxiety.     Principal Problem: MDD (major depressive disorder), recurrent severe, without psychosis ([facility]) Diagnosis:   Patient Active Problem List   Diagnosis Date Noted  . MDD (major depressive disorder), recurrent severe, without psychosis ([facility]) [F33.2] 03/10/2017   Total Time spent with patient: 25 minutes   Past Medical History: History reviewed. No pertinent past medical history. History reviewed. No pertinent surgical history. Family History: History reviewed. No pertinent family history.  Social History:  History  Alcohol Use  . 10.8 oz/week  . 18 Cans of beer per week     History  Drug Use  . Types: Marijuana    Social History   Social History  . Marital status: Single    Spouse name: N/A  . Number of children: N/A  . Years of education: N/A   Social History Main Topics  . Smoking status: Heavy Tobacco Smoker    Packs/day: 1.50    Types: Cigarettes  . Smokeless tobacco: Never Used  . Alcohol use 10.8 oz/week    18 Cans of beer per week  . Drug use: Yes    Types: Marijuana  . Sexual activity: Yes    Birth control/ protection: None   Other Topics Concern  . None   Social History Narrative  . None   Additional Social History:    Pain Medications: denies (hx  of opioid addiction, currently prescribed subutex) Prescriptions: denies (has prescription medications, no history of abusing them) Over the Counter: denies (denies) History of alcohol / drug use?: Yes Longest period of sobriety (when/how long):  (unknown) Negative Consequences of Use: Financial Withdrawal Symptoms:  (none reported) Name of Substance 1: alcohol (alcohol) 1 - Age of First Use: unknown 1 - Amount (size/oz): 2-3 beers 1 - Frequency: daily 1 - Duration: 4 months 1 - Last Use / Amount: today  Sleep: Good  Appetite:  Good  Current Medications: Current Facility-Administered Medications  Medication Dose Route Frequency Provider Last Rate Last Dose  . alum & mag hydroxide-simeth (MAALOX/MYLANTA) 200-200-20 MG/5ML suspension 30 mL  30 mL Oral Q4H PRN Okonkwo, Justina A, NP      . busPIRone (BUSPAR) tablet 10 mg  10 mg Oral BID Money, Gerlene Burdock, FNP      . DULoxetine (CYMBALTA) DR capsule 60 mg  60 mg Oral Daily Apollonia Amini, Rockey Situ, MD   60 mg at 03/15/17 0755  . gabapentin (NEURONTIN) capsule 600 mg  600 mg Oral TID Okley Magnussen, Rockey Situ, MD   600 mg at 03/15/17 1217  . hydrOXYzine (ATARAX/VISTARIL) tablet 25 mg  25 mg Oral TID PRN Ferne Reus A, NP   25 mg at 03/14/17 2236  . ibuprofen (ADVIL,MOTRIN) tablet 400 mg  400 mg Oral Q6H PRN Glade Strausser, Rockey Situ, MD   400 mg  at 03/12/17 1422  . lisinopril (PRINIVIL,ZESTRIL) tablet 10 mg  10 mg Oral Daily Money, Gerlene Burdock, FNP   10 mg at 03/15/17 0756  . magnesium hydroxide (MILK OF MAGNESIA) suspension 30 mL  30 mL Oral Daily PRN Okonkwo, Justina A, NP   30 mL at 03/14/17 0746  . nicotine (NICODERM CQ - dosed in mg/24 hours) patch 21 mg  21 mg Transdermal Daily Arfeen, Phillips Grout, MD   21 mg at 03/15/17 0757  . traZODone (DESYREL) tablet 50 mg  50 mg Oral QHS PRN Okonkwo, Justina A, NP   50 mg at 03/14/17 2236    Lab Results:  No results found for this or any previous visit (from the past 48 hour(s)).  Blood Alcohol level:  No results  found for: [facility]  Metabolic Disorder Labs: No results found for: HGBA1C, MPG No results found for: PROLACTIN No results found for: CHOL, TRIG, HDL, CHOLHDL, VLDL, LDLCALC  Physical Findings: AIMS:  , ,  ,  ,    CIWA:    COWS:     Musculoskeletal: Strength & Muscle Tone: within normal limits Gait & Station: normal Patient leans: N/A  Psychiatric Specialty Exam: Physical Exam  Nursing note and vitals reviewed. Constitutional: He is oriented to person, place, and time. He appears well-developed and well-nourished.  Cardiovascular: Normal rate.   Respiratory: Effort normal.  Musculoskeletal: Normal range of motion.  Neurological: He is alert and oriented to person, place, and time.  Skin: Skin is warm.    Review of Systems  Constitutional: Negative.   HENT: Negative.   Eyes: Negative.   Respiratory: Negative.   Cardiovascular: Negative.   Gastrointestinal: Negative.   Genitourinary: Negative.   Musculoskeletal: Negative.   Skin: Negative.   Neurological: Negative.   Endo/Heme/Allergies: Negative.   Psychiatric/Behavioral: Positive for depression and suicidal ideas. The patient is nervous/anxious.    denies chest pain, no shortness of breath, no vomiting , chronic hip pain   Blood pressure 127/83, pulse 99, temperature 98.2 F (36.8 C), temperature source Oral, resp. rate 18, height  (1.88 m), weight 96.4 kg (212 lb 8 oz), SpO2 100 %.Body mass index is 27.28 kg/m.  General Appearance: Fairly Groomed  Eye Contact:  Good  Speech:  Normal Rate  Volume:  Normal  Mood:  Reports depression, but affect is appropriate  Affect:  Appropriate  Thought Process:  Goal Directed and Descriptions of Associations: Intact  Orientation:  Full (Time, Place, and Person)  Thought Content:  denies hallucinations, no delusions, not internally preoccupied   Suicidal Thoughts:  Yes.  without intent/plan, continues to report passive thoughts of death, dying, but denies plan or intention of  hurting self or of suicide and is future oriented   Homicidal Thoughts:  No denies homicidal ideations  Memory:  recent and remote grossly intact   Judgement:  Good  Insight:  Good and Fair   Psychomotor Activity:  Normal  Concentration:  Concentration: Good and Attention Span: Good  Recall:  Good  Fund of Knowledge:  Good  Language:  Good  Akathisia:  Negative  Handed:  Right  AIMS (if indicated):     Assets:  Communication Skills Desire for Improvement Resilience  ADL's:  Intact  Cognition:  WNL  Sleep:  Number of Hours: 5    Treatment Plan Summary: Daily contact with patient to assess and evaluate symptoms and progress in treatment, Medication management, Plan inpatient treatment  and medications as below   -Encourage group and milieu  participation to work on Pharmacologist and symptom reduction -Continue Cymbalta  60 mgrs QDAY for depression ,anxiety, pain -Continue  Neurontin  To 600  mgrs TID to address chronic pain, anxiety -Continue Trazodone 50 mgrs QHS PRN for insomnia as needed  -Continue Vistaril 25 mgrs Q 8 hours PRN for anxiety as needed  -Start Buspar 10 mg PO BID for anxiety -Treatment team working on disposition planning options.  Gerlene Burdock Money, FNP 03/15/2017, 3:05 PM   I agree with NP Progress Note

## 2017-03-15 NOTE — Progress Notes (Signed)
Recreation Therapy Notes  Date: 03/15/17 Time: 0930 Location: [address] Group Room  Group Topic: Stress Management  Goal Area(s) Addresses:  Patient will verbalize importance of using healthy stress management.  Patient will identify positive emotions associated with healthy stress management.   Intervention: Stress Management  Activity :  Meditation.  LRT introduced the stress management technique of meditation.  LRT played a meditation from the Calm app to allow patients to engage in meditation to focus on trying to center their thoughts.  Education:  Stress Management, Discharge Planning.   Education Outcome: Acknowledges edcuation/In group clarification offered/Needs additional education  Clinical Observations/Feedback: Pt did not attend group.   Caroll Rancher, LRT/CTRS         Caroll Rancher A 03/15/2017 12:55 PM

## 2017-03-15 NOTE — Progress Notes (Signed)
D: Patient appears brighter today; interacting more with his peers.  Patient rates his depression as a 5; hopelessness and anxiety as a 6.  He continues to report thoughts of self harm with no specific plan.  Patient has been attending groups and participating.  Patient will be going to long term facility on Tuesday. A: Continue to monitor medication management and MD orders.  Safety checks completed every 15 minutes per protocol.  Offer support and encouragement as needed. R: Patient is receptive to staff; his behavior is appropriate.

## 2017-03-15 NOTE — Progress Notes (Signed)
D   Pt is pleasant on approach and is cooperative   He interacts appropriately with others   He continues to endorse depression and anxiety and he contracts for safety   Pt making positive statements about his discharge plans A   Verbal support given   Medications administered and effectiveness monitored  Q 15 min checks R   Pt is safe and receptive to verbal support

## 2017-03-15 NOTE — Plan of Care (Signed)
Problem: Education: Goal: Emotional status will improve Outcome: Progressing Patient has brighter affect; he is interacting well with his peers.

## 2017-03-15 NOTE — [facility] Group Notes (Signed)
LCSW Group Therapy Note  03/15/2017 1:15pm  Type of Therapy and Topic:  Group Therapy:  Feelings around Relapse and Recovery  Participation Level:  Active   Description of Group:    Patients in this group will discuss emotions they experience before and after a relapse. They will process how experiencing these feelings, or avoidance of experiencing them, relates to having a relapse. Facilitator will guide patients to explore emotions they have related to recovery. Patients will be encouraged to process which emotions are more powerful. They will be guided to discuss the emotional reaction significant others in their lives may have to their relapse or recovery. Patients will be assisted in exploring ways to respond to the emotions of others without this contributing to a relapse.  Therapeutic Goals: 1. Patient will identify two or more emotions that lead to a relapse for them 2. Patient will identify two emotions that result when they relapse 3. Patient will identify two emotions related to recovery 4. Patient will demonstrate ability to communicate their needs through discussion and/or role plays   Summary of Patient Progress: Pt participated actively in discussion. Pt identified triggers and warning signs for his relapse. He endorsed being motivated for recovery and is determined to use lessons gained from this relapse to prevent further relapses.     Therapeutic Modalities:   Cognitive Behavioral Therapy Solution-Focused Therapy Assertiveness Training Relapse Prevention Therapy   Verdene Lennert, LCSW 03/15/2017 3:13 PM

## 2017-03-16 MED ORDER — IBUPROFEN 600 MG PO TABS
600.0000 mg | ORAL_TABLET | Freq: Four times a day (QID) | ORAL | Status: DC | PRN
  Administered 2017-03-17 (×2): 600 mg via ORAL
  Filled 2017-03-16 (×3): qty 1

## 2017-03-16 NOTE — Progress Notes (Signed)
[facility] MD Progress Note  03/16/2017 2:13 PM Dominic Spencer  MRN:  [id number]   Subjective: Patient reports that he is not suicidal today, still having some minor depression but otherwise doing ok. He reports some right shoulder pain, but nothing out of the ordinary. He denies any SI/HI/AVH. He denies any side effects to the Buspar and feels as if it removed some of his anxiety.  Objective : Patient has remained in his bed most of the day,. He has been checked 2 times by me and denies any problems and just wanted to be lazy today. Will continue current medications and continue having him monitored in his room. He was requested to come out of his room today though and attend groups.   Principal Problem: MDD (major depressive disorder), recurrent severe, without psychosis (HCC) Diagnosis:   Patient Active Problem List   Diagnosis Date Noted  . MDD (major depressive disorder), recurrent severe, without psychosis (HCC) [F33.2] 03/10/2017   Total Time spent with patient: 25 minutes   Past Medical History: History reviewed. No pertinent past medical history. History reviewed. No pertinent surgical history. Family History: History reviewed. No pertinent family history.  Social History:  History  Alcohol Use  . 10.8 oz/week  . 18 Cans of beer per week     History  Drug Use  . Types: Marijuana    Social History   Social History  . Marital status: Single    Spouse name: N/A  . Number of children: N/A  . Years of education: N/A   Social History Main Topics  . Smoking status: Heavy Tobacco Smoker    Packs/day: 1.50    Types: Cigarettes  . Smokeless tobacco: Never Used  . Alcohol use 10.8 oz/week    18 Cans of beer per week  . Drug use: Yes    Types: Marijuana  . Sexual activity: Yes    Birth control/ protection: None   Other Topics Concern  . None   Social History Narrative  . None   Additional Social History:    Pain Medications: denies (hx of opioid addiction, currently  prescribed subutex) Prescriptions: denies (has prescription medications, no history of abusing them) Over the Counter: denies (denies) History of alcohol / drug use?: Yes Longest period of sobriety (when/how long):  (unknown) Negative Consequences of Use: Financial Withdrawal Symptoms:  (none reported) Name of Substance 1: alcohol (alcohol) 1 - Age of First Use: unknown 1 - Amount (size/oz): 2-3 beers 1 - Frequency: daily 1 - Duration: 4 months 1 - Last Use / Amount: today  Sleep: Good  Appetite:  Good  Current Medications: Current Facility-Administered Medications  Medication Dose Route Frequency Provider Last Rate Last Dose  . alum & mag hydroxide-simeth (MAALOX/MYLANTA) 200-200-20 MG/5ML suspension 30 mL  30 mL Oral Q4H PRN Okonkwo, Justina A, NP      . busPIRone (BUSPAR) tablet 10 mg  10 mg Oral BID Cacey Willow, Gerlene Burdock, FNP   10 mg at 03/16/17 0954  . DULoxetine (CYMBALTA) DR capsule 60 mg  60 mg Oral Daily Cobos, Rockey Situ, MD   60 mg at 03/16/17 0954  . gabapentin (NEURONTIN) capsule 600 mg  600 mg Oral TID Cobos, Rockey Situ, MD   600 mg at 03/16/17 0954  . hydrOXYzine (ATARAX/VISTARIL) tablet 25 mg  25 mg Oral TID PRN Beryle Lathe, Justina A, NP   25 mg at 03/15/17 2203  . ibuprofen (ADVIL,MOTRIN) tablet 400 mg  400 mg Oral Q6H PRN Cobos, Rockey Situ, MD  400 mg at 03/12/17 1422  . lisinopril (PRINIVIL,ZESTRIL) tablet 10 mg  10 mg Oral Daily Jeyson Deshotel, Gerlene Burdock, FNP   10 mg at 03/16/17 0955  . magnesium hydroxide (MILK OF MAGNESIA) suspension 30 mL  30 mL Oral Daily PRN Okonkwo, Justina A, NP   30 mL at 03/14/17 0746  . nicotine (NICODERM CQ - dosed in mg/24 hours) patch 21 mg  21 mg Transdermal Daily Arfeen, Phillips Grout, MD   21 mg at 03/15/17 0757  . traZODone (DESYREL) tablet 50 mg  50 mg Oral QHS PRN Beryle Lathe, Justina A, NP   50 mg at 03/15/17 2203    Lab Results:  No results found for this or any previous visit (from the past 48 hour(s)).  Blood Alcohol level:  No results found for:  [facility]  Metabolic Disorder Labs: No results found for: HGBA1C, MPG No results found for: PROLACTIN No results found for: CHOL, TRIG, HDL, CHOLHDL, VLDL, LDLCALC  Physical Findings: AIMS:  , ,  ,  ,    CIWA:    COWS:     Musculoskeletal: Strength & Muscle Tone: within normal limits Gait & Station: normal Patient leans: N/A  Psychiatric Specialty Exam: Physical Exam  Nursing note and vitals reviewed. Constitutional: He is oriented to person, place, and time. He appears well-developed and well-nourished.  Cardiovascular: Normal rate.   Respiratory: Effort normal.  Musculoskeletal: Normal range of motion.  Neurological: He is alert and oriented to person, place, and time.  Skin: Skin is warm.    Review of Systems  Constitutional: Negative.   HENT: Negative.   Eyes: Negative.   Respiratory: Negative.   Cardiovascular: Negative.   Gastrointestinal: Negative.   Genitourinary: Negative.   Musculoskeletal: Negative.   Skin: Negative.   Neurological: Negative.   Endo/Heme/Allergies: Negative.     Blood pressure 103/78, pulse 82, temperature 98.4 F (36.9 C), temperature source Oral, resp. rate 17, height  (1.88 m), weight 96.4 kg (212 lb 8 oz), SpO2 100 %.Body mass index is 27.28 kg/m.  General Appearance: Fairly Groomed  Eye Contact:  Good  Speech:  Normal Rate  Volume:  Normal  Mood:  Reports depression, but affect is appropriate  Affect:  Appropriate  Thought Process:  Goal Directed and Descriptions of Associations: Intact  Orientation:  Full (Time, Place, and Person)  Thought Content:  WDL  Suicidal Thoughts:  No   Homicidal Thoughts:  No   Memory:  Immediate;   Good Recent;   Good  Judgement:  Good  Insight:  Good and Fair   Psychomotor Activity:  Normal  Concentration:  Concentration: Good and Attention Span: Good  Recall:  Good  Fund of Knowledge:  Good  Language:  Good  Akathisia:  Negative  Handed:  Right  AIMS (if indicated):     Assets:   Communication Skills Desire for Improvement Resilience  ADL's:  Intact  Cognition:  WNL  Sleep:  Number of Hours: 5    Treatment Plan Summary: Daily contact with patient to assess and evaluate symptoms and progress in treatment, Medication management and Plan is to:   -Encourage group and milieu participation to work on Pharmacologist and symptom reduction -Continue Cymbalta  60 mgrs QDAY for depression ,anxiety, pain -Continue  Neurontin  To 600  mgrs TID to address chronic pain, anxiety -Continue Trazodone 50 mgrs QHS PRN for insomnia as needed  -Continue Vistaril 25 mgrs Q 8 hours PRN for anxiety as needed  -Continue Buspar 10 mg PO  BID for anxiety -Treatment team working on disposition planning options.  Gerlene Burdock Kathan Kirker, FNP 03/16/2017, 2:13 PM

## 2017-03-16 NOTE — [facility] Group Notes (Signed)
[facility] Group Notes:  (Nursing/MHT/Case Management/Adjunct)  Date:  03/16/2017  Time:  4:00 PM  Type of Therapy:  Psychoeducational Skills  Participation Level:  Did Not Attend  Participation Quality:  Did Not Attend  Affect:  Did Not Attend  Cognitive:  Did Not Attend  Insight:  None  Engagement in Group:  Did Not Attend  Modes of Intervention:  Did Not Attend  Summary of Progress/Problems: Pt did not attend patient self inventory group.  Dominic Spencer 03/16/2017, 4:00 PM 

## 2017-03-16 NOTE — Progress Notes (Signed)
Patient ID: Dominic Spencer, male   DOB: Jun 21, 1971, 46 y.o.   MRN: [id number]   D: Pt has been very flat and depressed on the unit today. Pt slept most of the day, he did not attend any groups nor did he engage in any treatment. Pt reported that he had no goal for today and that he was just being lazy. Pt reported that his depression was a 5, his hopelessness was a 5, and his anxiety was a 6. Pt reported that he remains positive for SI, but could contract for safety. Pt reported being negative HI, no AH/VH noted. A: 15 min checks continued for patient safety. R: Pt safety maintained.

## 2017-03-16 NOTE — [facility] Group Notes (Signed)
[facility] LCSW Group Therapy  03/16/2017  10:00 AM  Type of Therapy:  Group Therapy  Participation Level:  Did not Attend  Velma Agnes J Robet Crutchfield MSW, LCSW  

## 2017-03-16 NOTE — Progress Notes (Signed)
D   Pt is pleasant on approach and is cooperative   He interacts appropriately with others   He continues to endorse depression and anxiety and he contracts for safety   Pt making positive statements about his discharge plans A   Verbal support given   Medications administered and effectiveness monitored  Q 15 min checks R   Pt is safe and receptive to verbal support 

## 2017-03-17 ENCOUNTER — Encounter (HOSPITAL_COMMUNITY): Payer: Self-pay | Admitting: Registered Nurse

## 2017-03-17 DIAGNOSIS — M25512 Pain in left shoulder: Secondary | ICD-10-CM

## 2017-03-17 MED ORDER — IBUPROFEN 800 MG PO TABS
800.0000 mg | ORAL_TABLET | Freq: Four times a day (QID) | ORAL | Status: DC | PRN
  Administered 2017-03-17 – 2017-03-19 (×5): 800 mg via ORAL
  Filled 2017-03-17 (×5): qty 1

## 2017-03-17 NOTE — [facility] Group Notes (Signed)
Adult Psychoeducational Group Note  Date:  03/17/2017 Time:  4:21 AM  Group Topic/Focus:  Wrap-Up Group:   The focus of this group is to help patients review their daily goal of treatment and discuss progress on daily workbooks.  Participation Level:  Active  Participation Quality:  Appropriate and Attentive  Affect:  Appropriate  Cognitive:  Alert and Appropriate  Insight: Appropriate  Engagement in Group:  Engaged  Modes of Intervention:  Discussion and Education  Additional Comments:  Pt attended and participated in group. In reference to pt's day I asked what would they rate their day, in which they responded a 4/10. Pt goal was to stay up and go to groups during the day, but pt was not able to stay up during the day. Pt was sleepy due to their medications that they are taking for their injury. Pt did not have any positives that they could relay to the group.   Chrisandra Netters 03/17/2017, 4:21 AM

## 2017-03-17 NOTE — Progress Notes (Signed)
Patient ID: Dominic Spencer, male   DOB: [birth year], 46 y.o.   MRN: [id number]    D: Pt has been very bright and appropriate today on the unit. Pt attended all groups and engaged in treatment. Pt reported that he was having a better day and that he was able to get sleep. Pt reported that his depression was a 8, his hopelessness was a 8, and his anxiety was a 8. Pt reported that he remains positive for SI, but could contract for safety. Pt reported being negative HI, no AH/VH noted. Pt reported that he had no goal for today. A: 15 min checks continued for patient safety. R: Pt safety maintained.

## 2017-03-17 NOTE — Progress Notes (Signed)
[facility] MD Progress Note  03/17/2017 4:29 PM Dominic Spencer  MRN:  [id number]   Subjective: Patient states that he is having pain in his left shoulder for the last 2 days. Sleeping/eating without difficulty; Rates depression 7/10 and anxiety 7/10; denies suicidal/homicidal thoughts, psychosis, and paranoia.     Objective : Patient seen by provider.  Chart reviewed and face to face evaluation on 03/17/17.   On evaluation:  Dominic Spencer calm/cooperative, and oriented x 4.  Complaints of pain in left shoulder joint and wanting something for pain.  Tolerating medications without adverse reaction; eating/sleeping without difficulty; Denies suicidal/homicidal ideation, psychosis, and paranoia but continues to endorse depression and anxiety rating both 7/10 (0/none and 10/worse).      Principal Problem: MDD (major depressive disorder), recurrent severe, without psychosis (HCC) Diagnosis:   Patient Active Problem List   Diagnosis Date Noted  . MDD (major depressive disorder), recurrent severe, without psychosis (HCC) [F33.2] 03/10/2017   Total Time spent with patient: 25 minutes   Past Medical History: History reviewed. No pertinent past medical history. History reviewed. No pertinent surgical history. Family History: History reviewed. No pertinent family history.  Social History:  History  Alcohol Use  . 10.8 oz/week  . 18 Cans of beer per week     History  Drug Use  . Types: Marijuana    Social History   Social History  . Marital status: Single    Spouse name: N/A  . Number of children: N/A  . Years of education: N/A   Social History Main Topics  . Smoking status: Heavy Tobacco Smoker    Packs/day: 1.50    Types: Cigarettes  . Smokeless tobacco: Never Used  . Alcohol use 10.8 oz/week    18 Cans of beer per week  . Drug use: Yes    Types: Marijuana  . Sexual activity: Yes    Birth control/ protection: None   Other Topics Concern  . None   Social History Narrative  . None    Additional Social History:    Pain Medications: denies (hx of opioid addiction, currently prescribed subutex) Prescriptions: denies (has prescription medications, no history of abusing them) Over the Counter: denies (denies) History of alcohol / drug use?: Yes Longest period of sobriety (when/how long):  (unknown) Negative Consequences of Use: Financial Withdrawal Symptoms:  (none reported) Name of Substance 1: alcohol (alcohol) 1 - Age of First Use: unknown 1 - Amount (size/oz): 2-3 beers 1 - Frequency: daily 1 - Duration: 4 months 1 - Last Use / Amount: today  Sleep: Good  Appetite:  Good  Current Medications: Current Facility-Administered Medications  Medication Dose Route Frequency Provider Last Rate Last Dose  . alum & mag hydroxide-simeth (MAALOX/MYLANTA) 200-200-20 MG/5ML suspension 30 mL  30 mL Oral Q4H PRN Okonkwo, Justina A, NP      . busPIRone (BUSPAR) tablet 10 mg  10 mg Oral BID Money, Gerlene Burdock, FNP   10 mg at 03/17/17 1610  . DULoxetine (CYMBALTA) DR capsule 60 mg  60 mg Oral Daily Cobos, Rockey Situ, MD   60 mg at 03/17/17 0739  . gabapentin (NEURONTIN) capsule 600 mg  600 mg Oral TID Cobos, Rockey Situ, MD   600 mg at 03/17/17 1610  . hydrOXYzine (ATARAX/VISTARIL) tablet 25 mg  25 mg Oral TID PRN Ferne Reus A, NP   25 mg at 03/15/17 2203  . ibuprofen (ADVIL,MOTRIN) tablet 600 mg  600 mg Oral Q6H PRN Money, Gerlene Burdock, FNP   600  mg at 03/17/17 1325  . lisinopril (PRINIVIL,ZESTRIL) tablet 10 mg  10 mg Oral Daily Money, Gerlene Burdock, FNP   10 mg at 03/17/17 0739  . magnesium hydroxide (MILK OF MAGNESIA) suspension 30 mL  30 mL Oral Daily PRN Okonkwo, Justina A, NP   30 mL at 03/14/17 0746  . nicotine (NICODERM CQ - dosed in mg/24 hours) patch 21 mg  21 mg Transdermal Daily Arfeen, Phillips Grout, MD   21 mg at 03/15/17 0757  . traZODone (DESYREL) tablet 50 mg  50 mg Oral QHS PRN Beryle Lathe, Justina A, NP   50 mg at 03/15/17 2203    Lab Results:  No results found for this or  any previous visit (from the past 48 hour(s)).  Blood Alcohol level:  No results found for: [facility]  Metabolic Disorder Labs: No results found for: HGBA1C, MPG No results found for: PROLACTIN No results found for: CHOL, TRIG, HDL, CHOLHDL, VLDL, LDLCALC  Physical Findings: AIMS:  , ,  ,  ,    CIWA:    COWS:     Musculoskeletal: Strength & Muscle Tone: within normal limits Gait & Station: normal Patient leans: N/A  Psychiatric Specialty Exam: Physical Exam  Nursing note and vitals reviewed. Constitutional: He is oriented to person, place, and time. He appears well-developed and well-nourished.  Neck: Normal range of motion.  Cardiovascular: Normal rate.   Respiratory: Effort normal.  Musculoskeletal: Normal range of motion.  Neurological: He is alert and oriented to person, place, and time.  Skin: Skin is warm.    Review of Systems  Musculoskeletal: Positive for joint pain (Complaints of left shoulder pain).  Psychiatric/Behavioral: Positive for depression (7/10). Negative for suicidal ideas (Denies). Hallucinations: Denies. Nervous/anxious: 7/10. Insomnia: Stable.   All other systems reviewed and are negative.   Blood pressure 111/81, pulse 99, temperature 99.8 F (37.7 C), temperature source Oral, resp. rate 10, height  (1.88 m), weight 96.4 kg (212 lb 8 oz), SpO2 100 %.Body mass index is 27.28 kg/m.  General Appearance: Fairly Groomed  Eye Contact:  Good  Speech:  Normal Rate  Volume:  Normal  Mood:  Depressed  Affect:  Appropriate and Congruent  Thought Process:  Goal Directed and Descriptions of Associations: Intact  Orientation:  Full (Time, Place, and Person)  Thought Content:  WDL  Suicidal Thoughts:  No   Homicidal Thoughts:  No   Memory:  Immediate;   Good Recent;   Good  Judgement:  Good  Insight:  Good and Fair   Psychomotor Activity:  Normal  Concentration:  Concentration: Good and Attention Span: Good  Recall:  Good  Fund of Knowledge:  Good   Language:  Good  Akathisia:  No  Handed:  Right  AIMS (if indicated):     Assets:  Communication Skills Desire for Improvement Resilience  ADL's:  Intact  Cognition:  WNL  Sleep:  Number of Hours: 5    Treatment Plan Summary: Daily contact with patient to assess and evaluate symptoms and progress in treatment, Medication management and Plan is to:   -Encourage group and milieu participation to work on Pharmacologist and symptom reduction -Continue Cymbalta  60 mgrs QDAY for depression ,anxiety, pain -Continue  Neurontin  To 600  mgrs TID to address chronic pain, anxiety -Continue Trazodone 50 mgrs QHS PRN for insomnia as needed  -Continue Vistaril 25 mgrs Q 8 hours PRN for anxiety as needed  -Continue Buspar 10 mg PO BID for anxiety Increased Ibuprofen to  800 mg Qid prn for pain -Treatment team working on disposition planning options.  Rankin, Shuvon, NP 03/17/2017, 4:29 PM

## 2017-03-17 NOTE — [facility] Group Notes (Signed)
[facility] LCSW Group Therapy Note  03/17/2017  11:15 to 12 PM   Type of Therapy and Topic: Group Therapy: Feelings Around Returning Home & Establishing a Supportive Framework   Participation Level: Active    Description of Group:  Patients first processed thoughts and feelings about up coming discharge. These included fears of upcoming changes, lack of change, new living environments, judgements and expectations from others and overall stigma of MH issues. We then discussed what is a supportive framework? What does it look like feel like and how do I discern it from and unhealthy non-supportive network? Learn how to cope when supports are not helpful and don't support you. Discuss what to do when your family/friends are not supportive.   Therapeutic Goals Addressed in Processing Group:  1. Patient will identify one healthy supportive network that they can use at discharge. 2. Patient will identify one factor of a supportive framework and how to tell it from an unhealthy network. 3. Patient able to identify one coping skill to use when they do not have positive supports from others. 4. Patient will demonstrate ability to communicate their needs through discussion and/or role plays.  Summary of Patient Progress:  Pt engaged easily during group session. As patients processed their anxiety about discharge and described healthy supports patient was attentive to others as evidenced by providing tissues and laughter. Patient reports he is feeling much better today. Processed need for self care in form of medication compliance   Carney Bern, LCSW

## 2017-03-17 NOTE — Progress Notes (Signed)
Patient ID: Dominic Spencer, male   DOB: 15-Mar-1971, 46 y.o.   MRN: [id number]  Pt currently presents with an animated affect and anxious behavior. Interaction with patient is superficial and sarcastic. Pt minimizing. Pt endorses ongoing left shoulder pain. Pt reports good sleep with current medication regimen.   Pt provided with medications per providers orders. Pt's labs and vitals were monitored throughout the night. Pt given a 1:1 about emotional and mental status. Pt supported and encouraged to express concerns and questions. Pt educated on medications.  Pt's safety ensured with 15 minute and environmental checks. Pt currently denies SI/HI and A/V hallucinations. Pt verbally agrees to seek staff if SI/HI or A/VH occurs and to consult with staff before acting on any harmful thoughts. Will continue POC.

## 2017-03-18 MED ORDER — MIRTAZAPINE 7.5 MG PO TABS
7.5000 mg | ORAL_TABLET | Freq: Every day | ORAL | Status: DC
  Administered 2017-03-18: 7.5 mg via ORAL
  Filled 2017-03-18: qty 1
  Filled 2017-03-18: qty 7
  Filled 2017-03-18: qty 1
  Filled 2017-03-18: qty 7
  Filled 2017-03-18: qty 1

## 2017-03-18 NOTE — Progress Notes (Signed)
D: Pt presents with a flat affect and depressed mood. Pt rates depression 6/10. Anxiety 6/10. Hopelessness 6/10. Pt reported that he hasn't slept in two nights. Pt reports having racing thoughts at bedtime. Pt stated that he can't take trazodone for sleep because he doesn't like the way it makes him feel. Dr. Jama Flavors made aware of pt complaint. Pt endorses passive Si with no plan or intent. Pt verbally contracts for safety. A: Medications reviewed with pt. Medications administered as ordered per MD. Verbal support provided. Pt encouraged to attend groups. 15 minute checks performed for safety.  R: Pt receptive to tx.

## 2017-03-18 NOTE — Progress Notes (Signed)
Dominic Spencer had been up and visible in milieu this evening, attended and participated in evening group activity. Dominic Spencer spoke about some on-going insomnia as well as anxiety this evening. He spoke about possible discharge for in the morning and spoke about how he feels nervous about leaving. He did deny any SI this evening and was able to receive all bedtime medications without incident. A. Support and encouragement provided. R. Safety maintained, will continue to monitor.

## 2017-03-18 NOTE — [facility] Group Notes (Signed)
LCSW Group Therapy Note   03/18/2017 1:15pm   Type of Therapy and Topic:  Group Therapy:  Overcoming Obstacles   Participation Level:  Minimal   Description of Group:    In this group patients will be encouraged to explore what they see as obstacles to their own wellness and recovery. They will be guided to discuss their thoughts, feelings, and behaviors related to these obstacles. The group will process together ways to cope with barriers, with attention given to specific choices patients can make. Each patient will be challenged to identify changes they are motivated to make in order to overcome their obstacles. This group will be process-oriented, with patients participating in exploration of their own experiences as well as giving and receiving support and challenge from other group members.   Therapeutic Goals: 1. Patient will identify personal and current obstacles as they relate to admission. 2. Patient will identify barriers that currently interfere with their wellness or overcoming obstacles.  3. Patient will identify feelings, thought process and behaviors related to these barriers. 4. Patient will identify two changes they are willing to make to overcome these obstacles:        Therapeutic Modalities:   Cognitive Behavioral Therapy Solution Focused Therapy Motivational Interviewing Relapse Prevention Therapy  Marki Frede C Joline Encalada, LCSW 03/18/2017 2:45 PM 

## 2017-03-18 NOTE — Progress Notes (Signed)
[facility] MD Progress Note  03/18/2017 8:43 AM Dominic Spencer  MRN:  [id number]   Subjective: Reports he is feeling better overall, mood is better , feels less anxious, but complains of ongoing insomnia . States " I hardly slept at all last night". Denies medication side effects, but states " I really don't like Trazodone, I don't sleep well, but it does make me feel cloudy the next day". He is future oriented, and states he is ready to proceed with disposition plan  ( current plan is for patient to discharge tomorrow to go to [facility] [place], an inpatient rehab setting)    Objective :   I have discussed case with treatment team and have met with patient. Patient presents with partial improvement in mood and range of affect , but remains vaguely dysphoric, which he attributes to persistent insomnia. States he slept poorly last night, but is reluctant to increase Trazodone dose as he states he does not tolerate this medication well . We reviewed options, and he agrees to Remeron trial. Denies other medication side effects. As above, presents future oriented, with plan to go to rehab tomorrow. Denies current suicidal ideations. Visible on unit , going to some groups, no disruptive or agitated behaviors .  Principal Problem: MDD (major depressive disorder), recurrent severe, without psychosis ([facility]) Diagnosis:   Patient Active Problem List   Diagnosis Date Noted  . Acute pain of left shoulder [M25.512]   . MDD (major depressive disorder), recurrent severe, without psychosis ([place]) [F33.2] 03/10/2017   Total Time spent with patient: 20 minutes   Past Medical History: History reviewed. No pertinent past medical history. History reviewed. No pertinent surgical history. Family History: History reviewed. No pertinent family history.  Social History:  History  Alcohol Use  . 10.8 oz/week  . 18 Cans of beer per week     History  Drug Use  . Types: Marijuana    Social History   Social History  .  Marital status: Single    Spouse name: N/A  . Number of children: N/A  . Years of education: N/A   Social History Main Topics  . Smoking status: Heavy Tobacco Smoker    Packs/day: 1.50    Types: Cigarettes  . Smokeless tobacco: Never Used  . Alcohol use 10.8 oz/week    18 Cans of beer per week  . Drug use: Yes    Types: Marijuana  . Sexual activity: Yes    Birth control/ protection: None   Other Topics Concern  . None   Social History Narrative  . None   Additional Social History:    Pain Medications: denies (hx of opioid addiction, currently prescribed subutex) Prescriptions: denies (has prescription medications, no history of abusing them) Over the Counter: denies (denies) History of alcohol / drug use?: Yes Longest period of sobriety (when/how long):  (unknown) Negative Consequences of Use: Financial Withdrawal Symptoms:  (none reported) Name of Substance 1: alcohol (alcohol) 1 - Age of First Use: unknown 1 - Amount (size/oz): 2-3 beers 1 - Frequency: daily 1 - Duration: 4 months 1 - Last Use / Amount: today  Sleep: Poor  Appetite:  Good  Current Medications: Current Facility-Administered Medications  Medication Dose Route Frequency Provider Last Rate Last Dose  . alum & mag hydroxide-simeth (MAALOX/MYLANTA) 200-200-20 MG/5ML suspension 30 mL  30 mL Oral Q4H PRN Okonkwo, Justina A, NP      . busPIRone (BUSPAR) tablet 10 mg  10 mg Oral BID Money, Lowry Ram, FNP  10 mg at 03/18/17 0809  . DULoxetine (CYMBALTA) DR capsule 60 mg  60 mg Oral Daily Cobos, Myer Peer, MD   60 mg at 03/18/17 0810  . gabapentin (NEURONTIN) capsule 600 mg  600 mg Oral TID Cobos, Myer Peer, MD   600 mg at 03/18/17 0809  . hydrOXYzine (ATARAX/VISTARIL) tablet 25 mg  25 mg Oral TID PRN Lu Duffel, Justina A, NP   25 mg at 03/17/17 2129  . ibuprofen (ADVIL,MOTRIN) tablet 800 mg  800 mg Oral QID PRN Rankin, Shuvon B, NP   800 mg at 03/18/17 0639  . lisinopril (PRINIVIL,ZESTRIL) tablet 10 mg  10  mg Oral Daily Money, Lowry Ram, FNP   10 mg at 03/18/17 0809  . magnesium hydroxide (MILK OF MAGNESIA) suspension 30 mL  30 mL Oral Daily PRN Okonkwo, Justina A, NP   30 mL at 03/14/17 0746  . nicotine (NICODERM CQ - dosed in mg/24 hours) patch 21 mg  21 mg Transdermal Daily Arfeen, Arlyce Harman, MD   21 mg at 03/15/17 0757  . traZODone (DESYREL) tablet 50 mg  50 mg Oral QHS PRN Lu Duffel, Justina A, NP   50 mg at 03/15/17 2203    Lab Results:  No results found for this or any previous visit (from the past 48 hour(s)).  Blood Alcohol level:  No results found for: [facility]  Metabolic Disorder Labs: No results found for: HGBA1C, MPG No results found for: PROLACTIN No results found for: CHOL, TRIG, HDL, CHOLHDL, VLDL, LDLCALC  Physical Findings: AIMS:  , ,  ,  ,    CIWA:    COWS:     Musculoskeletal: Strength & Muscle Tone: within normal limits Gait & Station: normal Patient leans: N/A  Psychiatric Specialty Exam: Physical Exam  Nursing note and vitals reviewed. Constitutional: He is oriented to person, place, and time. He appears well-developed and well-nourished.  Neck: Normal range of motion.  Cardiovascular: Normal rate.   Respiratory: Effort normal.  Musculoskeletal: Normal range of motion.  Neurological: He is alert and oriented to person, place, and time.  Skin: Skin is warm.    Review of Systems  Musculoskeletal: Positive for joint pain (Complaints of left shoulder pain).  Psychiatric/Behavioral: Positive for depression (7/10). Negative for suicidal ideas (Denies). Hallucinations: Denies. Nervous/anxious: 7/10. Insomnia: Stable.   All other systems reviewed and are negative.   Blood pressure 113/84, pulse 85, temperature 98.6 F (37 C), temperature source Oral, resp. rate 16, height 6' 2"  (1.88 m), weight 96.4 kg (212 lb 8 oz), SpO2 100 %.Body mass index is 27.28 kg/m.  General Appearance: Fairly Groomed  Eye Contact:  Good  Speech:  Normal Rate  Volume:  Normal  Mood:   improving , less depressed, remains vaguely dysphoric  Affect:  Appropriate  Thought Process:  Linear and Descriptions of Associations: Intact  Orientation:  Other:  fully alert and attentive   Thought Content:  denies hallucinations, no delusions   Suicidal Thoughts:  No denies suicidal or self injurious ideations at this time, contracts for safety on unit   Homicidal Thoughts:  No   Memory:  Immediate;   Good Recent;   Good  Judgement:  Other:  improving   Insight:  improving    Psychomotor Activity:  Normal  Concentration:  Concentration: Good and Attention Span: Good  Recall:  Good  Fund of Knowledge:  Good  Language:  Good  Akathisia:  No  Handed:  Right  AIMS (if indicated):     Assets:  Communication Skills Desire for Improvement Resilience  ADL's:  Intact  Cognition:  WNL  Sleep:  Number of Hours: 0   Assessment- patient's mood presents partially improved, but remains vaguely dysphoric , anxious, which he attributes to insomnia. Prefers to change medication from Trazodone to another one, as he is not tolerating it well . He is future oriented, and at present plan is for discharge tomorrow to go to a residential rehab [facility]) . States he is looking forward to above .  Treatment Plan Summary: Treatment plan reviewed as today 9/17.  Daily contact with patient to assess and evaluate symptoms and progress in treatment, Medication management and Plan is to:   -Encourage group and milieu participation to work on coping skills and symptom reduction -Continue Cymbalta  60 mgrs QDAY for depression ,anxiety, pain -Continue  Neurontin   600  mgrs TID to address chronic pain, anxiety -D/C Trazodone  - Start Remeron 7.5 mgrs QHS for insomnia, depression -Continue Vistaril 25 mgrs Q 8 hours PRN for anxiety as needed  -Continue Buspar 10 mg PO BID for anxiety -Treatment team working on disposition planning options- see above   Jenne Campus, MD 03/18/2017, 8:43 AM    Patient ID: Belia Heman, male   DOB: 07-Sep-1970, 46 y.o.   MRN: [id number]

## 2017-03-18 NOTE — Progress Notes (Signed)
Recreation Therapy Notes  Date: 03/18/17 Time: 0930 Location: 300 Dayroom  Group Topic: Stress Management  Goal Area(s) Addresses:  Patient will verbalize importance of using healthy stress management.  Patient will identify positive emotions associated with healthy stress management.   Intervention: Stress Management  Activity :  Meditation.  LRT introduced the stress management technique of meditation.  Patients were to listen and follow along as a meditation played from the Calm app.    Education:  Stress Management, Discharge Planning.   Education Outcome: Acknowledges edcuation/In group clarification offered/Needs additional education  Clinical Observations/Feedback: Pt did not attend group.   Caroll Rancher, LRT/CTRS         Caroll Rancher A 03/18/2017 12:04 PM

## 2017-03-18 NOTE — Tx Team (Signed)
Interdisciplinary Treatment and Diagnostic Plan Update  03/18/2017 Time of Session: 9:30am Dominic Spencer MRN: [id number]  Principal Diagnosis: MDD (major depressive disorder), recurrent severe, without psychosis ([facility])  Secondary Diagnoses: Principal Problem:   MDD (major depressive disorder), recurrent severe, without psychosis ([facility]) Active Problems:   Acute pain of left shoulder   Current Medications:  Current Facility-Administered Medications  Medication Dose Route Frequency Provider Last Rate Last Dose  . alum & mag hydroxide-simeth (MAALOX/MYLANTA) 200-200-20 MG/5ML suspension 30 mL  30 mL Oral Q4H PRN Okonkwo, Justina A, NP      . busPIRone (BUSPAR) tablet 10 mg  10 mg Oral BID Money, Gerlene Burdock, FNP   10 mg at 03/18/17 0809  . DULoxetine (CYMBALTA) DR capsule 60 mg  60 mg Oral Daily Cobos, Rockey Situ, MD   60 mg at 03/18/17 0810  . gabapentin (NEURONTIN) capsule 600 mg  600 mg Oral TID Cobos, Rockey Situ, MD   600 mg at 03/18/17 0809  . hydrOXYzine (ATARAX/VISTARIL) tablet 25 mg  25 mg Oral TID PRN Beryle Lathe, Justina A, NP   25 mg at 03/17/17 2129  . ibuprofen (ADVIL,MOTRIN) tablet 800 mg  800 mg Oral QID PRN Rankin, Shuvon B, NP   800 mg at 03/18/17 0639  . lisinopril (PRINIVIL,ZESTRIL) tablet 10 mg  10 mg Oral Daily Money, Gerlene Burdock, FNP   10 mg at 03/18/17 0809  . magnesium hydroxide (MILK OF MAGNESIA) suspension 30 mL  30 mL Oral Daily PRN Okonkwo, Justina A, NP   30 mL at 03/14/17 0746  . nicotine (NICODERM CQ - dosed in mg/24 hours) patch 21 mg  21 mg Transdermal Daily Arfeen, Phillips Grout, MD   21 mg at 03/15/17 0757  . traZODone (DESYREL) tablet 50 mg  50 mg Oral QHS PRN Beryle Lathe, Justina A, NP   50 mg at 03/15/17 2203    PTA Medications: Prescriptions Prior to Admission  Medication Sig Dispense Refill Last Dose  . Buprenorphine HCl-Naloxone HCl (ZUBSOLV) 5.7-1.4 MG SUBL Place 1 tablet under the tongue 2 (two) times daily.   Past Month at Unknown time  . buPROPion (WELLBUTRIN XL) 150  MG 24 hr tablet Take 150 mg by mouth daily.   Past Month at Unknown time  . cloNIDine (CATAPRES) 0.1 MG tablet Take 0.1 mg by mouth 3 (three) times daily.   Past Month at Unknown time  . FLUoxetine (PROZAC) 20 MG capsule Take 20 mg by mouth daily.   Past Month at Unknown time  . gabapentin (NEURONTIN) 800 MG tablet Take 800 mg by mouth 3 (three) times daily.   Past Month at Unknown time    Treatment Modalities: Medication Management, Group therapy, Case management,  1 to 1 session with clinician, Psychoeducation, Recreational therapy.  Patient Stressors: Loss of housing and job.  Patient Strengths: Motivation for treatment/growth  Physician Treatment Plan for Primary Diagnosis: MDD (major depressive disorder), recurrent severe, without psychosis (HCC) Long Term Goal(s): Improvement in symptoms so as ready for discharge  Short Term Goals: Ability to verbalize feelings will improve Ability to disclose and discuss suicidal ideas Ability to maintain clinical measurements within normal limits will improve Compliance with prescribed medications will improve  Medication Management: Evaluate patient's response, side effects, and tolerance of medication regimen.  Therapeutic Interventions: 1 to 1 sessions, Unit Group sessions and Medication administration.  Evaluation of Outcomes: Adequate for Discharge  Physician Treatment Plan for Secondary Diagnosis: Principal Problem:   MDD (major depressive disorder), recurrent severe, without psychosis (HCC) Active Problems:  Acute pain of left shoulder   Long Term Goal(s): Improvement in symptoms so as ready for discharge  Short Term Goals: Ability to verbalize feelings will improve Ability to disclose and discuss suicidal ideas Ability to maintain clinical measurements within normal limits will improve Compliance with prescribed medications will improve  Medication Management: Evaluate patient's response, side effects, and tolerance of  medication regimen.  Therapeutic Interventions: 1 to 1 sessions, Unit Group sessions and Medication administration.  Evaluation of Outcomes: Adequate for Discharge   RN Treatment Plan for Primary Diagnosis: MDD (major depressive disorder), recurrent severe, without psychosis (HCC) Long Term Goal(s): Knowledge of disease and therapeutic regimen to maintain health will improve  Short Term Goals: Ability to disclose and discuss suicidal ideas, Ability to identify and develop effective coping behaviors will improve and Compliance with prescribed medications will improve  Medication Management: RN will administer medications as ordered by provider, will assess and evaluate patient's response and provide education to patient for prescribed medication. RN will report any adverse and/or side effects to prescribing provider.  Therapeutic Interventions: 1 on 1 counseling sessions, Psychoeducation, Medication administration, Evaluate responses to treatment, Monitor vital signs and CBGs as ordered, Perform/monitor CIWA, COWS, AIMS and Fall Risk screenings as ordered, Perform wound care treatments as ordered.  Evaluation of Outcomes: Adequate for Discharge   LCSW Treatment Plan for Primary Diagnosis: MDD (major depressive disorder), recurrent severe, without psychosis (HCC) Long Term Goal(s): Safe transition to appropriate next level of care at discharge, Engage patient in therapeutic group addressing interpersonal concerns.  Short Term Goals: Engage patient in aftercare planning with referrals and resources, Identify triggers associated with mental health/substance abuse issues and Increase skills for wellness and recovery  Therapeutic Interventions: Assess for all discharge needs, 1 to 1 time with Social worker, Explore available resources and support systems, Assess for adequacy in community support network, Educate family and significant other(s) on suicide prevention, Complete Psychosocial  Assessment, Interpersonal group therapy.  Evaluation of Outcomes: Adequate for Discharge   Progress in Treatment: Attending groups: Intermittently  Participating in groups: Intermittently Taking medication as prescribed: Yes, MD continues to assess for medication changes as needed Toleration medication: Yes, no side effects reported at this time Family/Significant other contact made: No, Pt declines Patient understands diagnosis: Yes AEB willingness to seek treatment Discussing patient identified problems/goals with staff: Yes Medical problems stabilized or resolved: Yes Denies suicidal/homicidal ideation: Yes Issues/concerns per patient self-inventory: None Other: N/A  New problem(s) identified: None identified at this time.   New Short Term/Long Term Goal(s): "get housing and get on my feet"  Discharge Plan or Barriers: Pt plans to discharge to an [facility] until he can save enough for his own apartment.  03/18/2017: Pt discharging to transitional housing at [facility] on 9/18 and will follow-up at [facility] in [place], [place]  Reason for Continuation of Hospitalization: None identified at this time.   Estimated Length of Stay: 1 day; est DC date 9/18  Attendees: Patient:  03/18/2017  10:35 AM  Physician: Dr. Jama Flavors, MD 03/18/2017  10:35 AM  Nursing: Armando Reichert RN 03/18/2017  10:35 AM  RN Care Manager: Onnie Boer, RN 03/18/2017  10:35 AM  Social Worker: Vernie Shanks, LCSW 03/18/2017  10:35 AM  Recreational Therapist:  03/18/2017  10:35 AM  Other: Armandina Stammer, NP; Reola Calkins, NP 03/18/2017  10:35 AM  Other:  03/18/2017  10:35 AM  Other: 03/18/2017  10:35 AM    Scribe for Treatment Team: Verdene Lennert, LCSW 03/18/2017 10:35 AM

## 2017-03-18 NOTE — Plan of Care (Signed)
Problem: Medication: Goal: Compliance with prescribed medication regimen will improve Outcome: Progressing Pt compliant with taking meds. No side effects to  meds verbalized by pt.    

## 2017-03-18 NOTE — Progress Notes (Signed)
Adult Psychoeducational Group Note  Date:  03/18/2017 Time:  1:45 PM  Group Topic/Focus:  Healthy Communication:   The focus of this group is to discuss communication, barriers to communication, as well as healthy ways to communicate with others.  Participation Level:  Active  Participation Quality:  Appropriate  Affect:  Appropriate  Cognitive:  Appropriate  Insight: Appropriate and Good  Engagement in Group:  Engaged  Modes of Intervention:  Activity  Additional Comments:  Pt participated in all group activities today.  Finleigh Cheong R Keirston Saephanh 03/18/2017, 1:45 PM

## 2017-03-19 MED ORDER — GABAPENTIN 300 MG PO CAPS: 600.0000 mg | ORAL_CAPSULE | Freq: Three times a day (TID) | ORAL | 0 refills | Status: DC

## 2017-03-19 MED ORDER — DULOXETINE HCL 60 MG PO CPEP: 60.0000 mg | ORAL_CAPSULE | Freq: Every day | ORAL | 0 refills | Status: DC

## 2017-03-19 MED ORDER — BUSPIRONE HCL 10 MG PO TABS: 10.0000 mg | ORAL_TABLET | Freq: Two times a day (BID) | ORAL | 0 refills | Status: DC

## 2017-03-19 MED ORDER — HYDROXYZINE HCL 25 MG PO TABS: 25.0000 mg | ORAL_TABLET | Freq: Three times a day (TID) | ORAL | 0 refills | Status: DC | PRN

## 2017-03-19 MED ORDER — MIRTAZAPINE 7.5 MG PO TABS: 7.5000 mg | ORAL_TABLET | Freq: Every day | ORAL | 0 refills | Status: DC

## 2017-03-19 MED ORDER — NICOTINE 21 MG/24HR TD PT24: 21.0000 mg | MEDICATED_PATCH | Freq: Every day | TRANSDERMAL | 0 refills | Status: DC

## 2017-03-19 MED ORDER — LISINOPRIL 10 MG PO TABS: 10.0000 mg | ORAL_TABLET | Freq: Every day | ORAL | 0 refills | Status: DC

## 2017-03-19 NOTE — Progress Notes (Signed)
Adult Psychoeducational Group Note  Date:  03/19/2017 Time:  12:56 AM  Group Topic/Focus:  Wrap-Up Group:   The focus of this group is to help patients review their daily goal of treatment and discuss progress on daily workbooks.  Participation Level:  Active  Participation Quality:  Appropriate  Affect:  Appropriate  Cognitive:  Appropriate  Insight: Appropriate  Engagement in Group:  Engaged  Modes of Intervention:  Discussion  Additional Comments:  Pt stated his goal for today was to talk with his doctor about his discharge plan. Pt stated he accomplished his goal today. Pt rated his over all day and 8 out of 10. Pt stated he attended all groups held today.  Felipa Furnace 03/19/2017, 12:56 AM

## 2017-03-19 NOTE — Progress Notes (Addendum)
Nursing Discharge Note 03/19/2017 [id number]  Data Reports sleeping good with PRN sleep med.  Rates depression 4/10, hopelessness 4/10, and anxiety 5/10. Affect anxious.  Denies HI, SI, AVH.  Reports feeling "scared about discharge" talked about how much uncertainty there is, but he reports he feels safe to leave and feels ready to do so.  Received discharge orders.  Action Spoke with patient 1:1, nurse offered support to patient throughout shift.  Reviewed medications, discharge instructions, and follow up appointments with patient. Medication samples and scripts reviewed and given to patietn.  Paperwork, AVS, SRA, and transition record handed to patient.   Escorted off of unit at 1350. Belongings returned per belongings form.  Discharged to lobby with bus pass per SW.  Patient verbalized intentions of getting a ride with another patient who was discharged today, RN advised against this. SW made aware.  Response Verbalized understanding of discharge teaching. Agrees to contact someone or 911 with thoughts/intent to harm self or others.    To follow up per AVS.

## 2017-03-19 NOTE — [facility] Suicide Risk Assessment (Signed)
[facility] Discharge Suicide Risk Assessment   Principal Problem: MDD (major depressive disorder), recurrent severe, without psychosis (HCC) Discharge Diagnoses:  Patient Active Problem List   Diagnosis Date Noted  . Acute pain of left shoulder [M25.512]   . MDD (major depressive disorder), recurrent severe, without psychosis ([facility]CC) [F33.2] 03/10/2017    Total Time spent with patient: 30 minutes  Musculoskeletal: Strength & Muscle Tone: within normal limits Gait & Station: normal Patient leans: N/A  Psychiatric Specialty Exam: ROS denies headache, no chest pain, no nausea, no vomiting, chronic hip pain  Blood pressure 132/87, pulse 88, temperature 99.6 F (37.6 C), temperature source Oral, resp. rate 16, height  (1.88 m), weight 96.4 kg (212 lb 8 oz), SpO2 100 %.Body mass index is 27.28 kg/m.  General Appearance: Well Groomed  Eye Contact::  Good  Speech:  Normal Rate409  Volume:  Normal  Mood:  improving mood , describes mood as 8/10 today  Affect:  appropriate, vaguely anxious, does smile at times appropriately  Thought Process:  Linear and Descriptions of Associations: Intact  Orientation:  Full (Time, Place, and Person)  Thought Content:  no hallucinations, no delusions   Suicidal Thoughts:  No denies suicidal or self injurious ideations, no homicidal or violent ideations  Homicidal Thoughts:  No  Memory:  recent and remote grossly intact   Judgement:  Other:  improving  Insight:  improving   Psychomotor Activity:  Normal  Concentration:  Good  Recall:  Good  Fund of Knowledge:Good  Language: Good  Akathisia:  Negative  Handed:  Right  AIMS (if indicated):     Assets:  Communication Skills Desire for Improvement Resilience  Sleep:  Number of Hours: 5.5  Cognition: WNL  ADL's:  Intact   Mental Status Per Nursing Assessment::   On Admission:     Demographic Factors:  46 year old male, divorced, had recently relocated from [place]  Loss Factors: Recent  relocation, homelessness, limited support network   Historical Factors: History of depression, history of suicide attempt by cutting self years ago. History of alcohol, cannabis abuse .  Risk Reduction Factors:   resilience  Continued Clinical Symptoms:  At this time patient is presenting with improving mood and range of affect, no thought disorder, no suicidal or self injurious ideations, no homicidal or violent ideations, no hallucinations, no delusions,  future oriented. Behavior on unit calm and in good control. Patient is planning on going to an [employer] . Denies medication side effects, and states he slept better last night ( on Remeron)   Cognitive Features That Contribute To Risk:  No gross cognitive deficits noted upon discharge. Is alert , attentive, and oriented x 3   Suicide Risk:  Mild:  Suicidal ideation of limited frequency, intensity, duration, and specificity.  There are no identifiable plans, no associated intent, mild dysphoria and related symptoms, good self-control (both objective and subjective assessment), few other risk factors, and identifiable protective factors, including available and accessible social support.  Follow-up Information    [facility] Follow up on 03/21/2017.   Why:  at 9:00am for your hospital discharge appointment. Please bring photo ID, social security card, and insurance card.  Contact information: [address] [place], [place] [address] Phone: [phone] Fax: [phone]       [facility] Follow up.   Specialty:  [facility] information: [address] [place] [address] [phone]           Plan Of Care/Follow-up recommendations:  Activity:  as tolerated  Diet:  Heart Healthy Tests:  NA Other:  See below  Patient is discharging in good spirits . States he plans to go to an [address] Follow up as above.   Craige Cotta, MD 03/19/2017, 10:32 AM

## 2017-03-19 NOTE — Progress Notes (Addendum)
  [facility] Adult Case Management Discharge Plan :  Will you be returning to the same living situation after discharge:  No. Pt plans to stay at a Recovery House At discharge, do you have transportation home?: Yes,  taxi voucher provided Do you have the ability to pay for your medications: Yes,  Pt provided with prescriptions  Release of information consent forms completed and in the chart;  Patient's signature needed at discharge.  Patient to Follow up at: Follow-up Information    [facility] Follow up on 03/21/2017.   Specialty:  Behavioral Health Why:  at 8:00am for your hospital discharge appointment. Please bring your photo ID and insurance card. Plan to stay for a few hours to complete this process. Contact information: [address] [place] [address] [phone]        [facility]. Schedule an appointment as soon as possible for a visit.   Why:  follow-up for chronic HTN and Primary  Care needs Contact information: [address] [place] [address] [phone]          Next level of care provider has access to [facility] Link:no  Safety Planning and Suicide Prevention discussed: Yes,  with Pt; declines family contact  Have you used any form of tobacco in the last 30 days? (Cigarettes, Smokeless Tobacco, Cigars, and/or Pipes): Yes  Has patient been referred to the Quitline?: Patient refused referral  Patient has been referred for addiction treatment: Yes  Verdene Lennert, LCSW 03/19/2017, 9:00 AM

## 2017-03-19 NOTE — Discharge Summary (Signed)
Physician Discharge Summary Note  Patient:  Dominic Spencer is an 46 y.o., male MRN:  [id number] DOB:  01/29/1971 Patient phone:  [phone] (home)  Patient address:   [place] [address],  Total Time spent with patient: 30 minutes  Date of Admission:  03/10/2017 Date of Discharge: 03/19/2017  Reason for Admission: Per HPI- TTS Admission Note: Patient is a 46 year old man admitted voluntarily from [facility]. Patient reports he was depressed and took himself to the hospital, where he told them, "I just want to die. I don't know what to do." Patient recently moved, with his GF and her kids from [place], into a friends trailer, with the friend and her family. This caused a great deal of friction between the two families and he and his GF and kids, "Were kicked out. My GF and the kids went to a women's shelter. I tried to get into a men's shelter but was not able to. I have been couch hopping since then." Patient says he has been depressed, sleeping less and is anxious. Patient also reports, "I took about 20 Advil at one time about a month, then about an hour later I took another 25. It didn't work and I never told anyone." patient is able to contract for safety while on the unit. Home meds listed are Zubsolv, Neurontin, Prozac, Lisinopril, and Clonidine.  Patient reports today that he moved from [facility] 4 months ago and has been off of all of his medications since then. He stated he was living with his parents and then he and his GF moved here to live with friends, but he will not detail why they moved. He reports that he has a lot of nerve pain and that is what the Zubsolv and Neurontin are for. I inform him that since he has no where to continue it outpatient and he has been off of it for 4 months then it will not be continued and he agrees and states understanding. He reports depression but denies SI/HI/AVH. He does not appear depressed and has appropriate affect. He reports that he is  depressed due to his situation, but he then states that he will go to a shelter when discharged from here. He agrees to start Cymbalta and Neurontin. Patient seem to giving limited information and may be seeking secondary gain from the hospital.  Principal Problem: MDD (major depressive disorder), recurrent severe, without psychosis [facility]) Discharge Diagnoses: Patient Active Problem List   Diagnosis Date Noted  . Acute pain of left shoulder [M25.512]   . MDD (major depressive disorder), recurrent severe, without psychosis (HCC) [F33.2] 03/10/2017    Past Psychiatric History:   Past Medical History: History reviewed. No pertinent past medical history. History reviewed. No pertinent surgical history. Family History: History reviewed. No pertinent family history. Family Psychiatric  History:  Social History:  History  Alcohol Use  . 10.8 oz/week  . 18 Cans of beer per week     History  Drug Use  . Types: Marijuana    Social History   Social History  . Marital status: Single    Spouse name: N/A  . Number of children: N/A  . Years of education: N/A   Social History Main Topics  . Smoking status: Heavy Tobacco Smoker    Packs/day: 1.50    Types: Cigarettes  . Smokeless tobacco: Never Used  . Alcohol use 10.8 oz/week    18 Cans of beer per week  . Drug use: Yes  Types: Marijuana  . Sexual activity: Yes    Birth control/ protection: None   Other Topics Concern  . None   Social History Narrative  . None    Hospital Course: Dominic Spencer was admitted for MDD (major depressive disorder), recurrent severe, without psychosis (HCC) , with psychosis and crisis management.  Pt was treated discharged with the medications listed below under Medication List.  Medical problems were identified and treated as needed.  Home medications were restarted as appropriate.  Improvement was monitored by observation and Dominic Spencer 's daily report of symptom reduction.  Emotional and mental  status was monitored by daily self-inventory reports completed by Dominic Spencer and clinical staff.         Dominic Spencer was evaluated by the treatment team for stability and plans for continued recovery upon discharge. Dominic Spencer 's motivation was an integral factor for scheduling further treatment. Employment, transportation, bed availability, health status, family support, and any pending legal issues were also considered during hospital stay. Pt was offered further treatment options upon discharge including but not limited to Residential, Intensive Outpatient, and Outpatient treatment.  Dominic Spencer will follow up with the services as listed below under Follow Up Information.     Upon completion of this admission the patient was both mentally and medically stable for discharge denying suicidal/homicidal ideation, auditory/visual/tactile hallucinations, delusional thoughts and paranoia.    Dominic Spencer responded well to treatment with Cymbalta 60 mg, Neurotin 600 mg and Remeron 7.5mg  without adverse effects.  Pt demonstrated improvement without reported or observed adverse effects to the point of stability appropriate for outpatient management. Pertinent labs include:Hepatic function panel for which outpatient follow-up is necessary for lab recheck as mentioned below. Reviewed CBC, CMP, BAL, and UDS; all unremarkable aside from noted exceptions.   Physical Findings: AIMS:  , ,  ,  ,    CIWA:    COWS:     Musculoskeletal: Strength & Muscle Tone: within normal limits Gait & Station: normal Patient leans: N/A  Psychiatric Specialty Exam: See SRA by MD Physical Exam  Vitals reviewed. Constitutional: He is oriented to person, place, and time. He appears well-developed.  Cardiovascular: Normal rate.   Neurological: He is alert and oriented to person, place, and time.  Psychiatric: He has a normal mood and affect. His behavior is normal.    Review of Systems  Psychiatric/Behavioral: Negative  for depression (stable) and suicidal ideas. The patient is not nervous/anxious.     Blood pressure 132/87, pulse 88, temperature 99.6 F (37.6 C), temperature source Oral, resp. rate 16, height  (1.88 m), weight 96.4 kg (212 lb 8 oz), SpO2 100 %.Body mass index is 27.28 kg/m.   Have you used any form of tobacco in the last 30 days? (Cigarettes, Smokeless Tobacco, Cigars, and/or Pipes): Yes  Has this patient used any form of tobacco in the last 30 days? (Cigarettes, Smokeless Tobacco, Cigars, and/or Pipes) Yes, Yes, A prescription for an FDA-approved tobacco cessation medication was offered at discharge and the patient refused  Blood Alcohol level:  No results found for: [facility]  Metabolic Disorder Labs:  No results found for: HGBA1C, MPG No results found for: PROLACTIN No results found for: CHOL, TRIG, HDL, CHOLHDL, VLDL, LDLCALC  See Psychiatric Specialty Exam and Suicide Risk Assessment completed by Attending Physician prior to discharge.  Discharge destination:  Home  Is patient on multiple antipsychotic therapies at discharge:  No   Has Patient had three or more failed trials of  antipsychotic monotherapy by history:  No  Recommended Plan for Multiple Antipsychotic Therapies: NA  Discharge Instructions    Diet - low sodium heart healthy    Complete by:  As directed    Discharge instructions    Complete by:  As directed    Take all medications as prescribed. Keep all follow-up appointments as scheduled.  Do not consume alcohol or use illegal drugs while on prescription medications. Report any adverse effects from your medications to your primary care provider promptly.  In the event of recurrent symptoms or worsening symptoms, call 911, a crisis hotline, or go to the nearest emergency department for evaluation.   Increase activity slowly    Complete by:  As directed      Allergies as of 03/19/2017   No Known Allergies     Medication List    STOP taking these  medications   buPROPion 150 MG 24 hr tablet Commonly known as:  WELLBUTRIN XL   cloNIDine 0.1 MG tablet Commonly known as:  CATAPRES   FLUoxetine 20 MG capsule Commonly known as:  PROZAC   gabapentin 800 MG tablet Commonly known as:  NEURONTIN Replaced by:  gabapentin 300 MG capsule   ZUBSOLV 5.7-1.4 MG Subl Generic drug:  Buprenorphine HCl-Naloxone HCl     TAKE these medications     Indication  busPIRone 10 MG tablet Commonly known as:  BUSPAR Take 1 tablet (10 mg total) by mouth 2 (two) times daily.  Indication:  Anxiety Disorder   DULoxetine 60 MG capsule Commonly known as:  CYMBALTA Take 1 capsule (60 mg total) by mouth daily.  Indication:  Generalized Anxiety Disorder   gabapentin 300 MG capsule Commonly known as:  NEURONTIN Take 2 capsules (600 mg total) by mouth 3 (three) times daily. Replaces:  gabapentin 800 MG tablet  Indication:  Agitation, Headache   hydrOXYzine 25 MG tablet Commonly known as:  ATARAX/VISTARIL Take 1 tablet (25 mg total) by mouth 3 (three) times daily as needed for anxiety.  Indication:  Feeling Anxious   lisinopril 10 MG tablet Commonly known as:  PRINIVIL,ZESTRIL Take 1 tablet (10 mg total) by mouth daily.  Indication:  High Blood Pressure Disorder   mirtazapine 7.5 MG tablet Commonly known as:  REMERON Take 1 tablet (7.5 mg total) by mouth at bedtime.  Indication:  Major Depressive Disorder   nicotine 21 mg/24hr patch Commonly known as:  NICODERM CQ - dosed in mg/24 hours Place 1 patch (21 mg total) onto the skin daily.  Indication:  Nicotine Addiction      Follow-up Information    [facility] Follow up on 03/21/2017.   Why:  at 9:00am for your hospital discharge appointment. Please bring photo ID, social security card, and insurance card.  Contact information: [address] [place], [place] [address] Phone: [phone] Fax: [phone]       [facility] Follow up.   Specialty:  Behavioral  Health Contact information: [address] [place] [address] [phone]        [facility]. Schedule an appointment as soon as possible for a visit.   Why:  follow-up for chronic HTN and Primary  Care needs Contact information: [address] [place] [address] [phone]          Follow-up recommendations:  Activity:  as tolerated Diet:  heart healthy Other:  Patient to follow-up with [facility]  Comments:  Take all medications as prescribed. Keep all  follow-up appointments as scheduled.  Do not consume alcohol or use illegal drugs while on prescription medications. Report any adverse effects from your medications to your primary care provider promptly.  In the event of recurrent symptoms or worsening symptoms, call 911, a crisis hotline, or go to the nearest emergency department for evaluation.   Signed: Oneta Rack, NP 03/19/2017, 10:51 AM  Patient seen, Suicide Assessment Completed.  Disposition Plan Reviewed

## 2017-07-13 ENCOUNTER — Emergency Department (HOSPITAL_COMMUNITY): Payer: Medicare HMO

## 2017-07-13 ENCOUNTER — Encounter (HOSPITAL_COMMUNITY): Payer: Self-pay

## 2017-07-13 ENCOUNTER — Emergency Department (HOSPITAL_COMMUNITY)
Admission: EM | Admit: 2017-07-13 | Discharge: 2017-07-13 | Disposition: A | Discharge status: Home / Self Care | Payer: Medicare HMO | Attending: Emergency Medicine | Admitting: Emergency Medicine

## 2017-07-13 ENCOUNTER — Other Ambulatory Visit: Payer: Self-pay

## 2017-07-13 DIAGNOSIS — F17210 Nicotine dependence, cigarettes, uncomplicated: Secondary | ICD-10-CM | POA: Insufficient documentation

## 2017-07-13 DIAGNOSIS — J181 Lobar pneumonia, unspecified organism: Secondary | ICD-10-CM | POA: Diagnosis not present

## 2017-07-13 DIAGNOSIS — Z79899 Other long term (current) drug therapy: Secondary | ICD-10-CM | POA: Diagnosis not present

## 2017-07-13 DIAGNOSIS — J189 Pneumonia, unspecified organism: Secondary | ICD-10-CM

## 2017-07-13 DIAGNOSIS — R05 Cough: Secondary | ICD-10-CM | POA: Diagnosis present

## 2017-07-13 LAB — I-STAT CHEM 8, ED
BUN: 11 mg/dL (ref 6–20)
CREATININE: 1.1 mg/dL (ref 0.61–1.24)
Calcium, Ion: 1.26 mmol/L (ref 1.15–1.40)
Chloride: 108 mmol/L (ref 101–111)
GLUCOSE: 94 mg/dL (ref 65–99)
HCT: 43 % (ref 39.0–52.0)
Hemoglobin: 14.6 g/dL (ref 13.0–17.0)
POTASSIUM: 4.1 mmol/L (ref 3.5–5.1)
Sodium: 142 mmol/L (ref 135–145)
TCO2: 22 mmol/L (ref 22–32)

## 2017-07-13 LAB — I-STAT TROPONIN, ED: TROPONIN I, POC: 0 ng/mL (ref 0.00–0.08)

## 2017-07-13 LAB — D-DIMER, QUANTITATIVE: D-Dimer, Quant: <0.27 ug/mL-FEU (ref 0.00–0.50)

## 2017-07-13 MED ORDER — BENZONATATE 100 MG PO CAPS: 100.0000 mg | ORAL_CAPSULE | Freq: Three times a day (TID) | ORAL | 0 refills | Status: DC | PRN

## 2017-07-13 MED ORDER — DOXYCYCLINE HYCLATE 100 MG PO CAPS: 100.0000 mg | ORAL_CAPSULE | Freq: Two times a day (BID) | ORAL | 0 refills | Status: DC

## 2017-07-13 MED ORDER — OXYCODONE-ACETAMINOPHEN 5-325 MG PO TABS: 1.0000 | ORAL_TABLET | Freq: Four times a day (QID) | ORAL | 0 refills | Status: DC | PRN

## 2017-07-13 MED ORDER — OXYCODONE-ACETAMINOPHEN 5-325 MG PO TABS
1.0000 | ORAL_TABLET | Freq: Once | ORAL | Status: AC
  Administered 2017-07-13: 1 via ORAL
  Filled 2017-07-13: qty 1

## 2017-07-13 MED ORDER — NAPROXEN 250 MG PO TABS
500.0000 mg | ORAL_TABLET | Freq: Once | ORAL | Status: AC
  Administered 2017-07-13: 500 mg via ORAL
  Filled 2017-07-13: qty 2

## 2017-07-13 NOTE — Discharge Instructions (Signed)
You were seen in the emergency department and diagnosed with pneumonia.    During your emergency department visit we got a chest x-ray, blood work, and an EKG.  Your chest x-ray showed pneumonia.  Your EKG looked similar to previous EKGs and was not concerning for a problem with your heart. Your blood work did not show signs of anemia or a problem with your electrolytes or kidney function. It also did not show signs of a blood clot or problem with your heart.   We will treat your pneumonia with Azithromycin-this is an antibiotic. Please take all of your antibiotics until finished. You may develop abdominal discomfort or diarrhea from the antibiotic.  You may help offset this with probiotics which you can buy at the store (ask your pharmacist if unable to find) or get probiotics in the form of eating yogurt. Do not eat or take the probiotics until 2 hours after your antibiotic. If you are unable to tolerate these side effects follow-up with your primary care provider or return to the emergency department.   If you begin to experience any blistering, rashes, swelling, or difficulty breathing seek medical care for evaluation of potentially more serious side effects.   Please be aware that this medication may interact with other medications you are taking, please be sure to discuss your medication list with your pharmacist.   We have also prescribed you two other medications:   - Tessalon- this is a cough medicine you may take every 8 hours as needed for cough.   - Naproxen- this is a nonsteroidal anti-inflammatory medication you may take once every 12 hours as needed for pain. Take this with food as it can cause stomach upset, and at worst stomach bleeding. Do not take other NSAIDs (ibuprofen, motrin, advil, aleve) with this as it will further upset your stomach. You may supplement with Tylenol.   Follow-up with your primary care doctor or with the the [facility] (information provided  in your discharge instructions) within the next 5 days for a reevaluation.  Return to the emergency department for any new or worsening symptoms including but not limited to difficulty breathing, chest pain, appearing pale or blue, persistent fever, or any other concerns you may have.

## 2017-07-13 NOTE — ED Notes (Signed)
Pt stable, ambulatory, states understanding of discharge instructions 

## 2017-07-13 NOTE — ED Triage Notes (Signed)
Onset 2 months non productive cough, sneezing.  Cough causing vomiting, interfering with sleep and daily activities.  Talking in complete sentences, NAD.

## 2017-07-13 NOTE — ED Provider Notes (Signed)
[facility] EMERGENCY DEPARTMENT Provider Note   CSN: [id number] Arrival date & time: 07/13/17  1758   History   Chief Complaint Chief Complaint  Patient presents with  . Cough    HPI Dominic Spencer is a 47 y.o. male a history of tobacco abuse who presents the emergency department complaining of intermittent progressively worsening cough for the past 2 months.  Patient describes the cough as dry, states that he is coughing so much that it is difficult to sleep, has also had some posttussive vomiting.  Otherwise no vomiting.  No hemoptysis or bloody emesis.  States he is having associated congestion, sore throat, right ear pain, chills, and general malaise.  Patient states he is also been having constant chest discomfort for the past 2-3 days that is pleuritic in nature, states that it is worse with coughing and worse with deep breath, it has no variation with exertion.  States he has had some mild dyspnea as well. Has tried Mucinex and Robitussin without relief.  Recent sick contact with a friend who was diagnosed with pneumonia.  Patient denies fevers, abdominal pain, palpitations, diarrhea, constipation, or blood in stool.  No nausea or diaphoresis.  Denies leg pain/swelling, hemoptysis, recent surgery/trauma, recent long travel, hormone use, personal hx of cancer, or hx of DVT/PE.   HPI  History reviewed. No pertinent past medical history.  Patient Active Problem List   Diagnosis Date Noted  . Acute pain of left shoulder   . MDD (major depressive disorder), recurrent severe, without psychosis (HCC) 03/10/2017    History reviewed. No pertinent surgical history.     Home Medications    Prior to Admission medications   Medication Sig Start Date End Date Taking? Authorizing Provider  DULoxetine (CYMBALTA) 60 MG capsule Take 1 capsule (60 mg total) by mouth daily. 03/20/17  Yes Oneta Rack, NP  gabapentin (NEURONTIN) 300 MG capsule Take 2 capsules (600 mg total)  by mouth 3 (three) times daily. Patient taking differently: Take 900 mg by mouth 3 (three) times daily.  03/19/17  Yes Oneta Rack, NP  lisinopril (PRINIVIL,ZESTRIL) 10 MG tablet Take 1 tablet (10 mg total) by mouth daily. 03/20/17  Yes Oneta Rack, NP  lithium carbonate (LITHOBID) 300 MG CR tablet Take 300-900 mg by mouth See admin instructions. 300mg  twice daily and 900mg  at bedtime   Yes [provider]  propranolol (INDERAL) 20 MG tablet Take 20 mg by mouth 2 (two) times daily.   Yes [provider]  QUEtiapine (SEROQUEL) 50 MG tablet Take 50-200 mg by mouth See admin instructions. 50mg  twice daily and 200mg  at bedtime   Yes [provider]  topiramate (TOPAMAX) 15 MG capsule Take 15 mg by mouth 2 (two) times daily.   Yes [provider]  busPIRone (BUSPAR) 10 MG tablet Take 1 tablet (10 mg total) by mouth 2 (two) times daily. Patient not taking: Reported on 07/13/2017 03/19/17   Oneta Rack, NP  hydrOXYzine (ATARAX/VISTARIL) 25 MG tablet Take 1 tablet (25 mg total) by mouth 3 (three) times daily as needed for anxiety. Patient not taking: Reported on 07/13/2017 03/19/17   Oneta Rack, NP  mirtazapine (REMERON) 7.5 MG tablet Take 1 tablet (7.5 mg total) by mouth at bedtime. Patient not taking: Reported on 07/13/2017 03/19/17   Oneta Rack, NP  nicotine (NICODERM CQ - DOSED IN MG/24 HOURS) 21 mg/24hr patch Place 1 patch (21 mg total) onto the skin daily. Patient not taking:  Reported on 07/13/2017 03/20/17   Oneta Rack, NP    Family History History reviewed. No pertinent family history.  Social History Social History   Tobacco Use  . Smoking status: Heavy Tobacco Smoker    Packs/day: 1.50    Types: Cigarettes  . Smokeless tobacco: Never Used  Substance Use Topics  . Alcohol use: No    Frequency: Never  . Drug use: Yes    Types: Marijuana     Allergies   Patient has no known allergies.   Review of Systems Review of Systems    Constitutional: Positive for chills and fatigue. Negative for fever.  HENT: Positive for congestion, ear pain (R) and sore throat. Negative for voice change.   Respiratory: Positive for cough and shortness of breath.   Cardiovascular: Positive for chest pain. Negative for palpitations and leg swelling.  Gastrointestinal: Positive for vomiting (post tussive emesis only). Negative for abdominal pain, blood in stool, constipation, diarrhea and nausea.  Musculoskeletal: Positive for myalgias (generalized).  Neurological: Negative for syncope, weakness and numbness.  All other systems reviewed and are negative.  Physical Exam Updated Vital Signs BP 129/88   Pulse (!) 108   Temp 98.4 F (36.9 C) (Oral)   Resp 20   SpO2 99%   Physical Exam  Constitutional: He appears well-developed and well-nourished.  Non-toxic appearance. No distress.  HENT:  Head: Normocephalic and atraumatic.  Right Ear: Tympanic membrane is not perforated, not erythematous, not retracted and not bulging.  Left Ear: Tympanic membrane is not perforated, not erythematous, not retracted and not bulging.  Nose: Mucosal edema present.  Mouth/Throat: Uvula is midline and oropharynx is clear and moist.  Eyes: Conjunctivae are normal. Right eye exhibits no discharge. Left eye exhibits no discharge.  Neck: Full passive range of motion without pain.  Cardiovascular: Regular rhythm. Tachycardia present.  No murmur heard. Pulses:      Radial pulses are 2+ on the right side, and 2+ on the left side.  Pulmonary/Chest: Effort normal and breath sounds normal. No respiratory distress. He has no wheezes. He has no rhonchi. He has no rales. He exhibits tenderness (mild diffuse to palpation, no focal tenderness). He exhibits no crepitus and no edema.  Abdominal: Soft. He exhibits no distension. There is no tenderness.  Neurological: He is alert.  Clear speech.   Skin: Skin is warm and dry. No rash noted.  Psychiatric: He has a normal  mood and affect. His behavior is normal.  Nursing note and vitals reviewed.   ED Treatments / Results  Labs Results for orders placed or performed during the hospital encounter of 07/13/17  D-dimer, quantitative  Result Value Ref Range   D-Dimer, Quant <0.27 0.00 - 0.50 ug/mL-FEU  I-stat Chem 8, ED  Result Value Ref Range   Sodium 142 135 - 145 mmol/L   Potassium 4.1 3.5 - 5.1 mmol/L   Chloride 108 101 - 111 mmol/L   BUN 11 6 - 20 mg/dL   Creatinine, Ser 1.91 0.61 - 1.24 mg/dL   Glucose, Bld 94 65 - 99 mg/dL   Calcium, Ion 4.78 2.95 - 1.40 mmol/L   TCO2 22 22 - 32 mmol/L   Hemoglobin 14.6 13.0 - 17.0 g/dL   HCT 62.1 30.8 - 65.7 %  I-stat troponin, ED  Result Value Ref Range   Troponin i, poc 0.00 0.00 - 0.08 ng/mL   Comment 3           EKG  EKG Interpretation  Date/Time:  Saturday July 13 2017 20:05:28 EST Ventricular Rate:  83 PR Interval:    QRS Duration: 90 QT Interval:  361 QTC Calculation: 425 R Axis:   49 Text Interpretation:  Sinus rhythm Probable left atrial enlargement ST elev, probable normal early repol pattern Baseline wander in lead(s) V1 V2 No significant change since last tracing Confirmed by Alvira Monday ([id number]) on 07/13/2017 8:16:57 PM       Radiology Dg Chest 2 View  Result Date: 07/13/2017 CLINICAL DATA:  Two months of nonproductive cough EXAM: CHEST  2 VIEW COMPARISON:  01/02/2017 FINDINGS: Streaky atelectasis versus minimal infiltrate at the left lung base. No pleural effusion. Normal cardiomediastinal silhouette. No pneumothorax. IMPRESSION: Streaky atelectasis versus minimal infiltrate at the left lung base. Electronically Signed   By: Jasmine Pang M.D.   On: 07/13/2017 19:58   Procedures Procedures (including critical care time)  Medications Ordered in ED Medications - No data to display   Initial Impression / Assessment and Plan / ED Course  I have reviewed the triage vital signs and the nursing notes.  Pertinent labs & imaging  results that were available during my care of the patient were reviewed by me and considered in my medical decision making (see chart for details).   Patient presents with URI symptoms as well as associated chest pain and dyspnea. Patient is nontoxic appearing,  notable tachycardia upon arrival which improved, patient is afebrile, no hypotension.  Given pain is pleuritic in nature with tachycardia, considering pulmonary embolism- Wells Score of 1.5 indicating low risk group- will evaluate with d-dimer. Will also evaluate with CXR, EKG, troponin, and chem 8.   EKG without significant change from last tracing 6 months prior, troponin negative- pain has been occurring for 2-3 days constantly, pain is nonexertional, doubt ACS.  Patient's pain is not a tearing sensation, pulses are symmetric, no mediastinum widening on CXR, doubt dissection. D-dimer WNL, patient denies leg pain/swelling, hemoptysis, recent surgery/trauma, recent long travel, hormone use, personal hx of cancer, or hx of DVT/PE, doubt pulmonary embolism. Notably patient is without electrolyte abnormality or anemia.   CXR with streaky atelectasis vs. minimal infiltrate- given patient's sxs will tx for CAP.   21:25: Re-eval: Discussed results and plan for treatment of pneumonia. Patient states some improvement in pain, however states it is still present. Will treat with Percocet.   Patient is hemodynamically stable, no respiratory distress. Ambulating without difficulty in the emergency department. Will treat community acquired pneumonia with doxycycline- patient without recent abx use or hospital admissions.  Will also treat symptomatically with Tessalon for cough and Percocet for pain. Prescription for NSAIDs avoided due to patient currently taking Lithium. [place] Controlled Substance reporting System queried. I discussed results, treatment plan, need for PCP follow-up, and return precautions with the patient. Provided opportunity for  questions, patient confirmed understanding and is in agreement with plan.   Vitals:   07/13/17 1823 07/13/17 2137  BP: 129/88 (!) 124/92  Pulse: (!) 108 87  Resp: 20 18  Temp: 98.4 F (36.9 C)   SpO2: 99% 100%   Final Clinical Impressions(s) / ED Diagnoses   Final diagnoses:  Community acquired pneumonia of left lower lobe of lung [facility])    ED Discharge Orders        Ordered    doxycycline (VIBRAMYCIN) 100 MG capsule  2 times daily     07/13/17 2139    benzonatate (TESSALON) 100 MG capsule  3 times daily PRN     07/13/17 2139  oxyCODONE-acetaminophen (PERCOCET/ROXICET) 5-325 MG tablet  Every 6 hours PRN     07/13/17 2139       Cherly Andersonetrucelli, Emeril Stille R, PA-C 07/13/17 2215    Alvira MondaySchlossman, Erin, MD 07/14/17 1404

## 2017-10-08 DIAGNOSIS — F1120 Opioid dependence, uncomplicated: Secondary | ICD-10-CM | POA: Diagnosis not present

## 2017-10-08 DIAGNOSIS — Z79891 Long term (current) use of opiate analgesic: Secondary | ICD-10-CM | POA: Diagnosis not present

## 2017-10-08 DIAGNOSIS — Z6828 Body mass index (BMI) 28.0-28.9, adult: Secondary | ICD-10-CM | POA: Diagnosis not present

## 2017-10-08 DIAGNOSIS — F1910 Other psychoactive substance abuse, uncomplicated: Secondary | ICD-10-CM | POA: Diagnosis not present

## 2017-10-08 DIAGNOSIS — F1994 Other psychoactive substance use, unspecified with psychoactive substance-induced mood disorder: Secondary | ICD-10-CM | POA: Diagnosis not present

## 2017-10-08 DIAGNOSIS — F1123 Opioid dependence with withdrawal: Secondary | ICD-10-CM | POA: Diagnosis not present

## 2017-10-11 DIAGNOSIS — Z6826 Body mass index (BMI) 26.0-26.9, adult: Secondary | ICD-10-CM | POA: Diagnosis not present

## 2017-10-11 DIAGNOSIS — G43909 Migraine, unspecified, not intractable, without status migrainosus: Secondary | ICD-10-CM | POA: Diagnosis not present

## 2017-10-11 DIAGNOSIS — F3181 Bipolar II disorder: Secondary | ICD-10-CM | POA: Diagnosis not present

## 2017-10-11 DIAGNOSIS — G629 Polyneuropathy, unspecified: Secondary | ICD-10-CM | POA: Diagnosis not present

## 2017-10-22 DIAGNOSIS — Z6826 Body mass index (BMI) 26.0-26.9, adult: Secondary | ICD-10-CM | POA: Diagnosis not present

## 2017-10-22 DIAGNOSIS — F1994 Other psychoactive substance use, unspecified with psychoactive substance-induced mood disorder: Secondary | ICD-10-CM | POA: Diagnosis not present

## 2017-10-22 DIAGNOSIS — F1123 Opioid dependence with withdrawal: Secondary | ICD-10-CM | POA: Diagnosis not present

## 2017-10-22 DIAGNOSIS — Z79891 Long term (current) use of opiate analgesic: Secondary | ICD-10-CM | POA: Diagnosis not present

## 2017-10-22 DIAGNOSIS — F1120 Opioid dependence, uncomplicated: Secondary | ICD-10-CM | POA: Diagnosis not present

## 2017-10-22 DIAGNOSIS — F1910 Other psychoactive substance abuse, uncomplicated: Secondary | ICD-10-CM | POA: Diagnosis not present

## 2017-11-05 DIAGNOSIS — F1123 Opioid dependence with withdrawal: Secondary | ICD-10-CM | POA: Diagnosis not present

## 2017-11-05 DIAGNOSIS — F1994 Other psychoactive substance use, unspecified with psychoactive substance-induced mood disorder: Secondary | ICD-10-CM | POA: Diagnosis not present

## 2017-11-05 DIAGNOSIS — F1910 Other psychoactive substance abuse, uncomplicated: Secondary | ICD-10-CM | POA: Diagnosis not present

## 2017-11-05 DIAGNOSIS — F1120 Opioid dependence, uncomplicated: Secondary | ICD-10-CM | POA: Diagnosis not present

## 2017-11-05 DIAGNOSIS — Z79891 Long term (current) use of opiate analgesic: Secondary | ICD-10-CM | POA: Diagnosis not present

## 2017-11-05 DIAGNOSIS — Z6826 Body mass index (BMI) 26.0-26.9, adult: Secondary | ICD-10-CM | POA: Diagnosis not present

## 2017-11-19 DIAGNOSIS — F1910 Other psychoactive substance abuse, uncomplicated: Secondary | ICD-10-CM | POA: Diagnosis not present

## 2017-11-19 DIAGNOSIS — Z6826 Body mass index (BMI) 26.0-26.9, adult: Secondary | ICD-10-CM | POA: Diagnosis not present

## 2017-11-19 DIAGNOSIS — F1123 Opioid dependence with withdrawal: Secondary | ICD-10-CM | POA: Diagnosis not present

## 2017-11-19 DIAGNOSIS — F1994 Other psychoactive substance use, unspecified with psychoactive substance-induced mood disorder: Secondary | ICD-10-CM | POA: Diagnosis not present

## 2017-11-19 DIAGNOSIS — Z79891 Long term (current) use of opiate analgesic: Secondary | ICD-10-CM | POA: Diagnosis not present

## 2017-11-19 DIAGNOSIS — F1120 Opioid dependence, uncomplicated: Secondary | ICD-10-CM | POA: Diagnosis not present

## 2017-12-03 DIAGNOSIS — F1123 Opioid dependence with withdrawal: Secondary | ICD-10-CM | POA: Diagnosis not present

## 2017-12-03 DIAGNOSIS — F1994 Other psychoactive substance use, unspecified with psychoactive substance-induced mood disorder: Secondary | ICD-10-CM | POA: Diagnosis not present

## 2017-12-03 DIAGNOSIS — F1120 Opioid dependence, uncomplicated: Secondary | ICD-10-CM | POA: Diagnosis not present

## 2017-12-03 DIAGNOSIS — Z79891 Long term (current) use of opiate analgesic: Secondary | ICD-10-CM | POA: Diagnosis not present

## 2017-12-03 DIAGNOSIS — F1910 Other psychoactive substance abuse, uncomplicated: Secondary | ICD-10-CM | POA: Diagnosis not present

## 2017-12-03 DIAGNOSIS — Z6824 Body mass index (BMI) 24.0-24.9, adult: Secondary | ICD-10-CM | POA: Diagnosis not present

## 2017-12-17 DIAGNOSIS — Z6824 Body mass index (BMI) 24.0-24.9, adult: Secondary | ICD-10-CM | POA: Diagnosis not present

## 2017-12-17 DIAGNOSIS — F1910 Other psychoactive substance abuse, uncomplicated: Secondary | ICD-10-CM | POA: Diagnosis not present

## 2017-12-17 DIAGNOSIS — F1994 Other psychoactive substance use, unspecified with psychoactive substance-induced mood disorder: Secondary | ICD-10-CM | POA: Diagnosis not present

## 2017-12-17 DIAGNOSIS — Z79891 Long term (current) use of opiate analgesic: Secondary | ICD-10-CM | POA: Diagnosis not present

## 2017-12-17 DIAGNOSIS — F1123 Opioid dependence with withdrawal: Secondary | ICD-10-CM | POA: Diagnosis not present

## 2017-12-17 DIAGNOSIS — F1120 Opioid dependence, uncomplicated: Secondary | ICD-10-CM | POA: Diagnosis not present

## 2017-12-26 DIAGNOSIS — M25552 Pain in left hip: Secondary | ICD-10-CM | POA: Diagnosis not present

## 2017-12-26 DIAGNOSIS — Z6824 Body mass index (BMI) 24.0-24.9, adult: Secondary | ICD-10-CM | POA: Diagnosis not present

## 2018-01-01 DIAGNOSIS — F1120 Opioid dependence, uncomplicated: Secondary | ICD-10-CM | POA: Diagnosis not present

## 2018-01-01 DIAGNOSIS — F1994 Other psychoactive substance use, unspecified with psychoactive substance-induced mood disorder: Secondary | ICD-10-CM | POA: Diagnosis not present

## 2018-01-01 DIAGNOSIS — Z6824 Body mass index (BMI) 24.0-24.9, adult: Secondary | ICD-10-CM | POA: Diagnosis not present

## 2018-01-01 DIAGNOSIS — Z79891 Long term (current) use of opiate analgesic: Secondary | ICD-10-CM | POA: Diagnosis not present

## 2018-01-01 DIAGNOSIS — F1123 Opioid dependence with withdrawal: Secondary | ICD-10-CM | POA: Diagnosis not present

## 2018-01-01 DIAGNOSIS — F1910 Other psychoactive substance abuse, uncomplicated: Secondary | ICD-10-CM | POA: Diagnosis not present

## 2018-01-02 DIAGNOSIS — F17210 Nicotine dependence, cigarettes, uncomplicated: Secondary | ICD-10-CM | POA: Diagnosis not present

## 2018-01-02 DIAGNOSIS — F1510 Other stimulant abuse, uncomplicated: Secondary | ICD-10-CM | POA: Diagnosis not present

## 2018-01-02 DIAGNOSIS — Z79899 Other long term (current) drug therapy: Secondary | ICD-10-CM | POA: Diagnosis not present

## 2018-01-02 DIAGNOSIS — R45851 Suicidal ideations: Secondary | ICD-10-CM | POA: Diagnosis not present

## 2018-01-02 DIAGNOSIS — F329 Major depressive disorder, single episode, unspecified: Secondary | ICD-10-CM | POA: Diagnosis not present

## 2018-01-03 ENCOUNTER — Other Ambulatory Visit: Payer: Self-pay

## 2018-01-03 ENCOUNTER — Inpatient Hospital Stay (HOSPITAL_COMMUNITY)
Admission: AD | Admit: 2018-01-03 | Discharge: 2018-01-14 | DRG: 885 | Disposition: A | Discharge status: Home / Self Care | Payer: Medicare HMO | Source: Intra-hospital | Attending: Psychiatry | Admitting: Psychiatry

## 2018-01-03 ENCOUNTER — Encounter (HOSPITAL_COMMUNITY): Payer: Self-pay | Admitting: *Deleted

## 2018-01-03 DIAGNOSIS — F1129 Opioid dependence with unspecified opioid-induced disorder: Secondary | ICD-10-CM

## 2018-01-03 DIAGNOSIS — Z818 Family history of other mental and behavioral disorders: Secondary | ICD-10-CM

## 2018-01-03 DIAGNOSIS — R45851 Suicidal ideations: Secondary | ICD-10-CM | POA: Diagnosis present

## 2018-01-03 DIAGNOSIS — F419 Anxiety disorder, unspecified: Secondary | ICD-10-CM | POA: Diagnosis not present

## 2018-01-03 DIAGNOSIS — F332 Major depressive disorder, recurrent severe without psychotic features: Principal | ICD-10-CM | POA: Diagnosis present

## 2018-01-03 DIAGNOSIS — F600 Paranoid personality disorder: Secondary | ICD-10-CM | POA: Diagnosis not present

## 2018-01-03 DIAGNOSIS — Z716 Tobacco abuse counseling: Secondary | ICD-10-CM

## 2018-01-03 DIAGNOSIS — Z598 Other problems related to housing and economic circumstances: Secondary | ICD-10-CM | POA: Diagnosis not present

## 2018-01-03 DIAGNOSIS — Z590 Homelessness: Secondary | ICD-10-CM

## 2018-01-03 DIAGNOSIS — G4700 Insomnia, unspecified: Secondary | ICD-10-CM | POA: Diagnosis present

## 2018-01-03 DIAGNOSIS — F1520 Other stimulant dependence, uncomplicated: Secondary | ICD-10-CM | POA: Diagnosis not present

## 2018-01-03 DIAGNOSIS — F1994 Other psychoactive substance use, unspecified with psychoactive substance-induced mood disorder: Secondary | ICD-10-CM | POA: Diagnosis not present

## 2018-01-03 DIAGNOSIS — Z62810 Personal history of physical and sexual abuse in childhood: Secondary | ICD-10-CM | POA: Diagnosis present

## 2018-01-03 DIAGNOSIS — Z79899 Other long term (current) drug therapy: Secondary | ICD-10-CM | POA: Diagnosis not present

## 2018-01-03 DIAGNOSIS — F1510 Other stimulant abuse, uncomplicated: Secondary | ICD-10-CM | POA: Diagnosis not present

## 2018-01-03 DIAGNOSIS — F1920 Other psychoactive substance dependence, uncomplicated: Secondary | ICD-10-CM

## 2018-01-03 DIAGNOSIS — F329 Major depressive disorder, single episode, unspecified: Secondary | ICD-10-CM | POA: Diagnosis not present

## 2018-01-03 DIAGNOSIS — F322 Major depressive disorder, single episode, severe without psychotic features: Secondary | ICD-10-CM | POA: Diagnosis not present

## 2018-01-03 DIAGNOSIS — R450 Nervousness: Secondary | ICD-10-CM | POA: Diagnosis not present

## 2018-01-03 DIAGNOSIS — Z915 Personal history of self-harm: Secondary | ICD-10-CM

## 2018-01-03 DIAGNOSIS — F1290 Cannabis use, unspecified, uncomplicated: Secondary | ICD-10-CM | POA: Diagnosis not present

## 2018-01-03 DIAGNOSIS — F17210 Nicotine dependence, cigarettes, uncomplicated: Secondary | ICD-10-CM | POA: Diagnosis present

## 2018-01-03 DIAGNOSIS — F1420 Cocaine dependence, uncomplicated: Secondary | ICD-10-CM | POA: Diagnosis not present

## 2018-01-03 DIAGNOSIS — Z630 Problems in relationship with spouse or partner: Secondary | ICD-10-CM | POA: Diagnosis not present

## 2018-01-03 MED ORDER — GABAPENTIN 400 MG PO CAPS
1200.0000 mg | ORAL_CAPSULE | Freq: Three times a day (TID) | ORAL | Status: DC
  Administered 2018-01-03 – 2018-01-13 (×31): 1200 mg via ORAL
  Filled 2018-01-03 (×36): qty 3

## 2018-01-03 MED ORDER — NICOTINE 21 MG/24HR TD PT24
21.0000 mg | MEDICATED_PATCH | Freq: Every day | TRANSDERMAL | Status: DC
  Administered 2018-01-04 – 2018-01-14 (×11): 21 mg via TRANSDERMAL
  Filled 2018-01-03 (×15): qty 1

## 2018-01-03 MED ORDER — ALUM & MAG HYDROXIDE-SIMETH 200-200-20 MG/5ML PO SUSP: 30.0000 mL | ORAL | Status: DC | PRN

## 2018-01-03 MED ORDER — MAGNESIUM HYDROXIDE 400 MG/5ML PO SUSP: 30.0000 mL | Freq: Every day | ORAL | Status: DC | PRN

## 2018-01-03 MED ORDER — TRAZODONE HCL 50 MG PO TABS
50.0000 mg | ORAL_TABLET | Freq: Every evening | ORAL | Status: DC | PRN
  Filled 2018-01-03: qty 1

## 2018-01-03 MED ORDER — HYDROXYZINE HCL 25 MG PO TABS
25.0000 mg | ORAL_TABLET | Freq: Three times a day (TID) | ORAL | Status: DC | PRN
  Administered 2018-01-03 – 2018-01-05 (×3): 25 mg via ORAL
  Filled 2018-01-03 (×4): qty 1

## 2018-01-03 MED ORDER — BUPRENORPHINE HCL-NALOXONE HCL 8-2 MG SL SUBL
1.0000 | SUBLINGUAL_TABLET | Freq: Two times a day (BID) | SUBLINGUAL | Status: DC
  Administered 2018-01-03 – 2018-01-14 (×22): 1 via SUBLINGUAL
  Filled 2018-01-03 (×22): qty 1

## 2018-01-03 MED ORDER — ACETAMINOPHEN 325 MG PO TABS
650.0000 mg | ORAL_TABLET | Freq: Four times a day (QID) | ORAL | Status: DC | PRN
  Administered 2018-01-04: 650 mg via ORAL
  Filled 2018-01-03: qty 2

## 2018-01-03 NOTE — Progress Notes (Signed)
Pt attended AA group this evening.  

## 2018-01-03 NOTE — Progress Notes (Signed)
D    Pt is pleasant on approach and cooperative   He attends groups and interacts appropriately with others   He requested medication for anxiety and received vistaril  A   Verbal support given    Medications administered and effectiveness monitored   Q 15 min checks R    Pt is safe at present time and receptive to verbal support

## 2018-01-03 NOTE — [facility] Group Notes (Addendum)
[facility] Group Notes:  (Nursing/MHT/Case Management/Adjunct)  Date:  01/03/2018  Time:  4:00 pm  Type of Therapy:  Nurse Education  Participation Level:  Active  Participation Quality:  Appropriate and Attentive  Affect:  Anxious and Excited  Cognitive:  Alert, Appropriate and Oriented  Insight:  Appropriate  Engagement in Group:  Engaged  Modes of Intervention:  Activity  Summary of Progress/Problems: Pt was engaged and laughing. Pt participated and was interactive with peers.  Suszanne ConnersMichael R Emberlyn Burlison 01/03/2018, 5:31 PM

## 2018-01-03 NOTE — Progress Notes (Signed)
Dominic Spencer is a 47 year old male pt admitted on voluntary basis [facility]. On admission, he endorses depression and passive SI but is able to contract for safety while in the hospital. He reports that he has not been taking his medications and reports that he has been abusing meth. He reports some on-going conflict with his girlfriend and reports that he is essentially homeless and reports that he is wanting to get into a sober house when he gets discharged. Dominic Spencer was oriented to the unit and safety maintained.
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
## 2018-01-03 NOTE — Tx Team (Signed)
Initial Treatment Plan 01/03/2018 6:00 PM Dominic DialsSteven Grzelak Spencer    PATIENT STRESSORS: Marital or family conflict Medication change or noncompliance Substance abuse   PATIENT STRENGTHS: Ability for insight Average or above average intelligence Capable of independent living General fund of knowledge   PATIENT IDENTIFIED PROBLEMS: Depression Substance abuse Suicidal thoughts "Get back on my medications"                     DISCHARGE CRITERIA:  Ability to meet basic life and health needs Improved stabilization in mood, thinking, and/or behavior Reduction of life-threatening or endangering symptoms to within safe limits Verbal commitment to aftercare and medication compliance  PRELIMINARY DISCHARGE PLAN: Attend aftercare/continuing care group  PATIENT/FAMILY INVOLVEMENT: This treatment plan has been presented to and reviewed with the patient, Dominic DialsSteven Spencer, and/or family member, .  The patient and family have been given the opportunity to ask questions and make suggestions.  Dominic Spencer, Dominic Spencer, [place]RN 01/03/2018, 6:00 PM

## 2018-01-04 DIAGNOSIS — F1420 Cocaine dependence, uncomplicated: Secondary | ICD-10-CM

## 2018-01-04 DIAGNOSIS — F1520 Other stimulant dependence, uncomplicated: Secondary | ICD-10-CM

## 2018-01-04 DIAGNOSIS — F1994 Other psychoactive substance use, unspecified with psychoactive substance-induced mood disorder: Secondary | ICD-10-CM

## 2018-01-04 DIAGNOSIS — G4700 Insomnia, unspecified: Secondary | ICD-10-CM

## 2018-01-04 DIAGNOSIS — F1129 Opioid dependence with unspecified opioid-induced disorder: Secondary | ICD-10-CM

## 2018-01-04 DIAGNOSIS — F1920 Other psychoactive substance dependence, uncomplicated: Secondary | ICD-10-CM

## 2018-01-04 LAB — TSH: TSH: 1.525 u[IU]/mL (ref 0.350–4.500)

## 2018-01-04 MED ORDER — SERTRALINE HCL 25 MG PO TABS
25.0000 mg | ORAL_TABLET | Freq: Every day | ORAL | Status: DC
  Administered 2018-01-04 – 2018-01-05 (×2): 25 mg via ORAL
  Filled 2018-01-04 (×4): qty 1
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
## 2018-01-04 NOTE — [facility] Group Notes (Signed)
LCSW Group Therapy Note  01/04/2018    10:30-11:30am   Type of Therapy and Topic:  Group Therapy: Anger and Coping Skills  Participation Level:  Minimal   Description of Group:   In this group, patients learned how to recognize the physical, cognitive, emotional, and behavioral responses they have to anger-provoking situations.  They identified how they usually or often react when angered, and learned how healthy and unhealthy coping skills work initially, but the unhealthy ones stop working.   They analyzed how their frequently-chosen coping skill is possibly beneficial and how it is possibly unhelpful.  The group discussed a variety of healthier coping skills that could help in resolving the actual issues, as well as how to go about planning for the the possibility of future similar situations.  Therapeutic Goals: 1. Patients will identify one thing that makes them angry and how they feel emotionally and physically, what their thoughts are or tend to be in those situations, and what healthy or unhealthy coping mechanism they typically use 2. Patients will identify how their coping technique works for them, as well as how it works against them. 3. Patients will explore possible new behaviors to use in future anger situations. 4. Patients will learn that anger itself is normal and cannot be eliminated, and that healthier coping skills can assist with resolving conflict rather than worsening situations.  Summary of Patient Progress:  The patient shared that he does not get angry.  He listened attentively and made some comments, apologized for his irritation in the morning when he refused his PSA.  Therapeutic Modalities:   Cognitive Behavioral Therapy Motivation Interviewing  Lynnell ChadMareida J Grossman-Orr  .
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
## 2018-01-04 NOTE — [facility] Counselor (Signed)
Clinical Social Work Note  Psychosocial Assessment attempted with patient, who declined at the present time, stating he did not feel well.  CSW to follow up.  Ambrose MantleMareida Grossman-Orr, LCSW 01/04/2018, 8:36 AM

## 2018-01-04 NOTE — Plan of Care (Signed)
Pt continues to progress towards goals and d/c. RN will continue to monitor.  

## 2018-01-04 NOTE — [facility] Group Notes (Signed)
[facility] Group Notes:  (Nursing)  Date:  01/04/2018  Time: 1:15 PM Type of Therapy:  Nurse Education  Participation Level:  Did Not Attend     Dominic Spencer 01/04/2018, 3:46 PM

## 2018-01-04 NOTE — [facility] Suicide Risk Assessment (Signed)
[facility] INPATIENT:  Family/Significant Other Suicide Prevention Education  Suicide Prevention Education:  Patient Refusal for Family/Significant Other Suicide Prevention Education: The patient Aura DialsSteven Mckendry has refused to provide written consent for family/significant other to be provided Family/Significant Other Suicide Prevention Education during admission and/or prior to discharge.  Physician notified.  Suicide Prevention Education was reviewed thoroughly with patient, including risk factors, warning signs, and what to do.  Mobile Crisis services were described and that telephone number pointed out, with encouragement to patient to put this number in personal cell phone.  Brochure was provided to patient to share with natural supports.  Patient acknowledged the ways in which they are at risk, and how working through each of their issues can gradually start to reduce their risk factors.  Patient was encouraged to think of the information in the context of people in their own lives.  Patient denied having access to firearms  Patient verbalized understanding of information provided.      Carloyn JaegerMareida J Grossman-Orr 01/04/2018, 6:03 PM
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
## 2018-01-04 NOTE — Progress Notes (Signed)
D. Pt observed in milieu interacting appropriately with peers- pt friendly upon approach.Per pt's self inventory, pt rates his depression, hopelessness and anxiety an 8/8/9, respectively. Pt currently endorses passive SI, but agrees to contact staff before acting on any harmful thoughts.  A. Labs and vitals monitored. Pt compliant with  medications. Pt supported emotionally and encouraged to express concerns and ask questions.   R. Pt remains safe with 15 minute checks. Will continue POC.

## 2018-01-05 DIAGNOSIS — F322 Major depressive disorder, single episode, severe without psychotic features: Secondary | ICD-10-CM

## 2018-01-05 MED ORDER — SERTRALINE HCL 50 MG PO TABS
50.0000 mg | ORAL_TABLET | Freq: Every day | ORAL | Status: DC
  Administered 2018-01-06 – 2018-01-07 (×2): 50 mg via ORAL
  Filled 2018-01-05 (×4): qty 1

## 2018-01-05 NOTE — Progress Notes (Signed)
D. Pt calm and cooperative- friendly upon approach- interacts well with peers in milieu. Pt did not attend group this am, but stayed in bed to rest. Pt continues to endorse passive SI with no plan, but  does agree to contact staff before acting on any harmful thoughts.  A. Labs and vitals monitored. Pt compliant with medications. Pt supported emotionally and encouraged to express concerns and ask questions.   R. Pt remains safe with 15 minute checks. Will continue POC.

## 2018-01-05 NOTE — [facility] Group Notes (Signed)
[facility] Group Notes:  (Nursing)  Date:  01/05/2018  Time:1:15 PM Type of Therapy:  Nurse Education  Participation Level:  Active  Participation Quality:  Appropriate  Affect:  Appropriate  Cognitive:  Appropriate  Insight:  Appropriate    Engagement in Group:  Engaged  Modes of Intervention:  Discussion and Education  Summary of Progress/Problems:  Nurse led group- Coping skills (Relaxation Techniques)  Shela NevinValerie S Henri Guedes 01/05/2018, 5:38 PM

## 2018-01-05 NOTE — Progress Notes (Signed)
Nursing note 7p-7a  Pt observed interacting positively with peers on unit this shift. Displayed a depressed affect and mood upon interaction with this Clinical research associatewriter. Pt complains of pain in his hips 9/10 as well as anxiety and insomia see MAR for prn medication administration. Pt denies HI, and also denies any audio or visual hallucinations at this time. Pt does endorse passive SI but is able to verbally contract for safety with this RN. Pt is now resting in bed with eyes closed, with no signs or symptoms of pain or distress noted. Pt continues to remain safe on the unit and is observed by rounding every 15 min. RN will continue to monitor.
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
## 2018-01-05 NOTE — [facility] Group Notes (Signed)
[facility] Group Notes: (Clinical Social Work)   01/05/2018      Type of Therapy:  Group Therapy   Participation Level:  Did Not Attend despite MHT prompting   Markisha Meding N Justiss Gerbino, LCSW  01/05/2018 11:45 AM   

## 2018-01-05 NOTE — Progress Notes (Signed)
Patient did attend the evening speaker AA meeting.  

## 2018-01-06 MED ORDER — HYDROXYZINE HCL 50 MG PO TABS
50.0000 mg | ORAL_TABLET | Freq: Three times a day (TID) | ORAL | Status: DC | PRN
  Administered 2018-01-06 – 2018-01-07 (×4): 50 mg via ORAL
  Filled 2018-01-06 (×4): qty 1

## 2018-01-06 MED ORDER — TRAZODONE HCL 100 MG PO TABS
100.0000 mg | ORAL_TABLET | Freq: Every evening | ORAL | Status: DC | PRN
  Filled 2018-01-06: qty 1

## 2018-01-06 NOTE — Progress Notes (Signed)
Pt was observed in the dayroom, seen interacting with peers. Pt appears animated/anxious in affect and mood. Pt denies SI/HI/AVH/Pain at this time. PRN vistaril requested and given. Pt states he would like vistaril to be scheduled and not a PRN. Pt was encourage to speak with provider. Pt states vistaril has been effective at managing his anxiety. Pt engages well with peers in milieu.Will continue with POC.
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
## 2018-01-06 NOTE — Tx Team (Signed)
Interdisciplinary Treatment and Diagnostic Plan Update  01/06/2018 Time of Session: [id number] Dominic DialsSteven Spencer MRN: [id number]  Principal Diagnosis: MDD (major depressive disorder), severe (HCC)  Secondary Diagnoses: Principal Problem:   MDD (major depressive disorder), severe (HCC) Active Problems:   Substance induced mood disorder (HCC)   Cocaine use disorder, severe, dependence (HCC)   Methamphetamine use disorder, severe, dependence (HCC)   Opioid dependence with opioid-induced disorder (HCC)   Polysubstance dependence (HCC)   Current Medications:  Current Facility-Administered Medications  Medication Dose Route Frequency Provider Last Rate Last Dose  . acetaminophen (TYLENOL) tablet 650 mg  650 mg Oral Q6H PRN Money, Gerlene Burdockravis B, FNP   650 mg at 01/04/18 2205  . alum & mag hydroxide-simeth (MAALOX/MYLANTA) 200-200-20 MG/5ML suspension 30 mL  30 mL Oral Q4H PRN Money, Feliz Beamravis B, FNP      . buprenorphine-naloxone (SUBOXONE) 8-2 mg per SL tablet 1 tablet  1 tablet Sublingual BID Money, Gerlene Burdockravis B, FNP   1 tablet at 01/06/18 0829  . gabapentin (NEURONTIN) capsule 1,200 mg  1,200 mg Oral TID Money, Gerlene Burdockravis B, FNP   1,200 mg at 01/06/18 0827  . hydrOXYzine (ATARAX/VISTARIL) tablet 25 mg  25 mg Oral TID PRN Money, Gerlene Burdockravis B, FNP   25 mg at 01/05/18 1827  . magnesium hydroxide (MILK OF MAGNESIA) suspension 30 mL  30 mL Oral Daily PRN Money, Feliz Beamravis B, FNP      . nicotine (NICODERM CQ - dosed in mg/24 hours) patch 21 mg  21 mg Transdermal Daily Antonieta Pertlary, Greg Lawson, MD   21 mg at 01/06/18 0829  . sertraline (ZOLOFT) tablet 50 mg  50 mg Oral Daily Oneta RackLewis, Tanika N, NP   50 mg at 01/06/18 0828  . traZODone (DESYREL) tablet 50 mg  50 mg Oral QHS PRN Money, Gerlene Burdockravis B, FNP       PTA Medications: Medications Prior to Admission  Medication Sig Dispense Refill Last Dose  . benzonatate (TESSALON) 100 MG capsule Take 1 capsule (100 mg total) by mouth 3 (three) times daily as needed for cough. 21 capsule 0   .  doxycycline (VIBRAMYCIN) 100 MG capsule Take 1 capsule (100 mg total) by mouth 2 (two) times daily. 14 capsule 0   . DULoxetine (CYMBALTA) 60 MG capsule Take 1 capsule (60 mg total) by mouth daily. 30 capsule 0 07/13/2017 at Unknown time  . gabapentin (NEURONTIN) 300 MG capsule Take 2 capsules (600 mg total) by mouth 3 (three) times daily. (Patient taking differently: Take 900 mg by mouth 3 (three) times daily. ) 180 capsule 0 07/13/2017 at Unknown time  . lisinopril (PRINIVIL,ZESTRIL) 10 MG tablet Take 1 tablet (10 mg total) by mouth daily. 30 tablet 0 07/13/2017 at Unknown time  . lithium carbonate (LITHOBID) 300 MG CR tablet Take 300-900 mg by mouth See admin instructions. 300mg  twice daily and 900mg  at bedtime   07/13/2017 at Unknown time  . oxyCODONE-acetaminophen (PERCOCET/ROXICET) 5-325 MG tablet Take 1 tablet by mouth every 6 (six) hours as needed for severe pain. 10 tablet 0   . propranolol (INDERAL) 20 MG tablet Take 20 mg by mouth 2 (two) times daily.   07/13/2017 at 1500  . QUEtiapine (SEROQUEL) 50 MG tablet Take 50-200 mg by mouth See admin instructions. 50mg  twice daily and 200mg  at bedtime   07/13/2017 at Unknown time  . topiramate (TOPAMAX) 15 MG capsule Take 15 mg by mouth 2 (two) times daily.   07/13/2017 at Unknown time    Patient Stressors: Marital or family  conflict Medication change or noncompliance Substance abuse  Patient Strengths: Ability for insight Average or above average intelligence Capable of independent living General fund of knowledge  Treatment Modalities: Medication Management, Group therapy, Case management,  1 to 1 session with clinician, Psychoeducation, Recreational therapy.   Physician Treatment Plan for Primary Diagnosis: MDD (major depressive disorder), severe (HCC) Long Term Goal(s): Improvement in symptoms so as ready for discharge Improvement in symptoms so as ready for discharge   Short Term Goals: Ability to identify changes in lifestyle to reduce  recurrence of condition will improve Ability to disclose and discuss suicidal ideas Ability to demonstrate self-control will improve Ability to identify and develop effective coping behaviors will improve Ability to identify and develop effective coping behaviors will improve Ability to maintain clinical measurements within normal limits will improve Compliance with prescribed medications will improve Ability to identify triggers associated with substance abuse/mental health issues will improve  Medication Management: Evaluate patient's response, side effects, and tolerance of medication regimen.  Therapeutic Interventions: 1 to 1 sessions, Unit Group sessions and Medication administration.  Evaluation of Outcomes: Progressing  Physician Treatment Plan for Secondary Diagnosis: Principal Problem:   MDD (major depressive disorder), severe (HCC) Active Problems:   Substance induced mood disorder (HCC)   Cocaine use disorder, severe, dependence (HCC)   Methamphetamine use disorder, severe, dependence (HCC)   Opioid dependence with opioid-induced disorder (HCC)   Polysubstance dependence (HCC)  Long Term Goal(s): Improvement in symptoms so as ready for discharge Improvement in symptoms so as ready for discharge   Short Term Goals: Ability to identify changes in lifestyle to reduce recurrence of condition will improve Ability to disclose and discuss suicidal ideas Ability to demonstrate self-control will improve Ability to identify and develop effective coping behaviors will improve Ability to identify and develop effective coping behaviors will improve Ability to maintain clinical measurements within normal limits will improve Compliance with prescribed medications will improve Ability to identify triggers associated with substance abuse/mental health issues will improve     Medication Management: Evaluate patient's response, side effects, and tolerance of medication  regimen.  Therapeutic Interventions: 1 to 1 sessions, Unit Group sessions and Medication administration.  Evaluation of Outcomes: Progressing   RN Treatment Plan for Primary Diagnosis: MDD (major depressive disorder), severe (HCC) Long Term Goal(s): Knowledge of disease and therapeutic regimen to maintain health will improve  Short Term Goals: Ability to remain free from injury will improve, Ability to verbalize frustration and anger appropriately will improve, Ability to demonstrate self-control and Ability to disclose and discuss suicidal ideas  Medication Management: RN will administer medications as ordered by provider, will assess and evaluate patient's response and provide education to patient for prescribed medication. RN will report any adverse and/or side effects to prescribing provider.  Therapeutic Interventions: 1 on 1 counseling sessions, Psychoeducation, Medication administration, Evaluate responses to treatment, Monitor vital signs and CBGs as ordered, Perform/monitor CIWA, COWS, AIMS and Fall Risk screenings as ordered, Perform wound care treatments as ordered.  Evaluation of Outcomes: Progressing   LCSW Treatment Plan for Primary Diagnosis: MDD (major depressive disorder), severe (HCC) Long Term Goal(s): Safe transition to appropriate next level of care at discharge, Engage patient in therapeutic group addressing interpersonal concerns.  Short Term Goals: Engage patient in aftercare planning with referrals and resources, Facilitate patient progression through stages of change regarding substance use diagnoses and concerns and Identify triggers associated with mental health/substance abuse issues  Therapeutic Interventions: Assess for all discharge needs, 1 to 1 time  with Social worker, Explore available resources and support systems, Assess for adequacy in community support network, Educate family and significant other(s) on suicide prevention, Complete Psychosocial  Assessment, Interpersonal group therapy.  Evaluation of Outcomes: Progressing   Progress in Treatment: Attending groups: Yes. Participating in groups: Yes. Taking medication as prescribed: Yes. Toleration medication: Yes. Family/Significant other contact made: SPE completed with pt; pt declined to consent to collateral contact.  Patient understands diagnosis: Yes. Discussing patient identified problems/goals with staff: Yes. Medical problems stabilized or resolved: Yes. Denies suicidal/homicidal ideation: Yes. Issues/concerns per patient self-inventory: No. Other: n/a   New problem(s) identified: No, Describe:  n/a  New Short Term/Long Term Goal(s): detox, medication management for mood stabilization; elimination of SI thoughts; development of comprehensive mental wellness/sobriety plan.   Patient Goals:  "to get help with depression and suicidal thoughts and to get back on my medicine."   Discharge Plan or Barriers: CSW assessing for appropriate referrals. He goes to [facility] currentl for [employer]. He is interested in medication management in [place] a d possibly returning to [facility] halfway house. [facility]AG pamphlet, Mobile Crisis information, and AA/NA information provided to patient for additional community support and resources.   Reason for Continuation of Hospitalization: Anxiety Depression Medication stabilization Suicidal ideation Withdrawal symptoms  Estimated Length of Stay: Thursday, 01/09/18  Attendees: Patient: Dominic Spencer  01/06/2018 10:02 AM  Physician: Dr. Jola Babinski MD; Dr. Viviano Simas MD 01/06/2018 10:02 AM  Nursing: Meriam Sprague RN; [facility] RN 01/06/2018 10:02 AM  RN Care Manager:x 01/06/2018 10:02 AM  Social Worker: Corrie Mckusick LCSW 01/06/2018 10:02 AM  Recreational Therapist: x 01/06/2018 10:02 AM  Other: Gilda Crease NP; Hillery Jacks NP  01/06/2018 10:02 AM  Other:  01/06/2018 10:02 AM  Other: 01/06/2018 10:02 AM    Scribe for Treatment  Team: Rona Ravens, LCSW 01/06/2018 10:02 AM

## 2018-01-06 NOTE — Plan of Care (Signed)
  Problem: Education: Goal: Knowledge of [place] [facility] Education information/materials will improve Outcome: Progressing   Problem: Safety: Goal: Periods of time without injury will increase Outcome: Progressing   Problem: Medication: Goal: Compliance with prescribed medication regimen will improve Outcome: Progressing D: Pt visible in hall on initial approach. Endorsed passive SI when assessed "sometimes I just want to end it all" denies HI, AVH and pain. Rates his depression, anxiety and hopelessness all 8/10. Brightened up as shift progressed. Observed in milieu for majority of this evening, interacts well with others. A: Introduced self to pt. Emotional support and availability provided to pt.  Updated pt about changes made to current treatment regimen and compliance encouraged. Scheduled and PRN medications given with verbal education and effects monitored. Safety checks maintained at Q 15 minutes intervals without outburst or self harm gestures to note thus far.   R: Pt receptive to care. Compliant with medications when offered. Denies adverse drug reactions when assessed this shift. Off unit for meals and activities, returned without issues. Tolerates all PO intake well. POC maintained for safety and mood stability.

## 2018-01-06 NOTE — Progress Notes (Signed)
Recreation Therapy Notes  Date: 7.8.19 Time: 0930 Location: 300 Hall Dayroom  Group Topic: Stress Management  Goal Area(s) Addresses:  Patient will verbalize importance of using healthy stress management.  Patient will identify positive emotions associated with healthy stress management.   Intervention: Stress Management  Activity :  Meditation.  LRT introduced the stress management technique of meditation.  LRT played a meditation on choice.  Patients were to listen and follow along as meditation played.  Education:  Stress Management, Discharge Planning.   Education Outcome: Acknowledges edcuation/In group clarification offered/Needs additional education  Clinical Observations/Feedback:  Pt did not attend group.     Axavier Pressley, LRT/CTRS         Marianne Golightly A 01/06/2018 11:52 AM 

## 2018-01-06 NOTE — Progress Notes (Signed)
Pt was observed in the dayroom, attending AA meeting. Pt appears animated/anxious in affect and mood. Pt denies SI/HI/AVH/Pain at this time. PRNs offered;Pt denies. Pt states he don't like to take medications. Pt engages well with peers in milieu. Pt states he hopes to go to a recovery house once discharge.Will continue with POC.

## 2018-01-07 DIAGNOSIS — F419 Anxiety disorder, unspecified: Secondary | ICD-10-CM

## 2018-01-07 DIAGNOSIS — Z598 Other problems related to housing and economic circumstances: Secondary | ICD-10-CM

## 2018-01-07 DIAGNOSIS — F17210 Nicotine dependence, cigarettes, uncomplicated: Secondary | ICD-10-CM

## 2018-01-07 DIAGNOSIS — R450 Nervousness: Secondary | ICD-10-CM

## 2018-01-07 DIAGNOSIS — F1290 Cannabis use, unspecified, uncomplicated: Secondary | ICD-10-CM

## 2018-01-07 DIAGNOSIS — Z630 Problems in relationship with spouse or partner: Secondary | ICD-10-CM

## 2018-01-07 MED ORDER — SERTRALINE HCL 50 MG PO TABS
75.0000 mg | ORAL_TABLET | Freq: Every day | ORAL | Status: DC
  Administered 2018-01-08 – 2018-01-12 (×5): 75 mg via ORAL
  Filled 2018-01-07 (×7): qty 1

## 2018-01-07 MED ORDER — HYDROXYZINE HCL 50 MG PO TABS
50.0000 mg | ORAL_TABLET | Freq: Four times a day (QID) | ORAL | Status: DC | PRN
  Administered 2018-01-07 – 2018-01-14 (×13): 50 mg via ORAL
  Filled 2018-01-07 (×5): qty 1
  Filled 2018-01-07: qty 2
  Filled 2018-01-07 (×7): qty 1
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
## 2018-01-07 NOTE — Plan of Care (Signed)
  Problem: Education: Goal: Knowledge of [facility] Education information/materials will improve Outcome: Progressing Goal: Emotional status will improve Outcome: Progressing Goal: Mental status will improve Outcome: Progressing Goal: Verbalization of understanding the information provided will improve Outcome: Progressing   Problem: Activity: Goal: Interest or engagement in activities will improve Outcome: Progressing Goal: Sleeping patterns will improve Outcome: Progressing   Problem: Coping: Goal: Ability to verbalize frustrations and anger appropriately will improve Outcome: Progressing Goal: Ability to demonstrate self-control will improve Outcome: Progressing   Problem: Health Behavior/Discharge Planning: Goal: Identification of resources available to assist in meeting health care needs will improve Outcome: Progressing Goal: Compliance with treatment plan for underlying cause of condition will improve Outcome: Progressing   Problem: Physical Regulation: Goal: Ability to maintain clinical measurements within normal limits will improve Outcome: Progressing   Problem: Safety: Goal: Periods of time without injury will increase Outcome: Progressing   Problem: Education: Goal: Ability to make informed decisions regarding treatment will improve Outcome: Progressing   Problem: Coping: Goal: Coping ability will improve Outcome: Progressing   Problem: Health Behavior/Discharge Planning: Goal: Identification of resources available to assist in meeting health care needs will improve Outcome: Progressing   Problem: Medication: Goal: Compliance with prescribed medication regimen will improve Outcome: Progressing   Problem: Self-Concept: Goal: Ability to disclose and discuss suicidal ideas will improve Outcome: Progressing Goal: Will verbalize positive feelings about self Outcome: Progressing   Problem: Education: Goal: Utilization of techniques to improve  thought processes will improve Outcome: Progressing Goal: Knowledge of the prescribed therapeutic regimen will improve Outcome: Progressing   Problem: Activity: Goal: Interest or engagement in leisure activities will improve Outcome: Progressing Goal: Imbalance in normal sleep/wake cycle will improve Outcome: Progressing   Problem: Coping: Goal: Coping ability will improve Outcome: Progressing Goal: Will verbalize feelings Outcome: Progressing   Problem: Health Behavior/Discharge Planning: Goal: Ability to make decisions will improve Outcome: Progressing Goal: Compliance with therapeutic regimen will improve Outcome: Progressing   Problem: Role Relationship: Goal: Will demonstrate positive changes in social behaviors and relationships Outcome: Progressing   Problem: Safety: Goal: Ability to disclose and discuss suicidal ideas will improve Outcome: Progressing Goal: Ability to identify and utilize support systems that promote safety will improve Outcome: Progressing   Problem: Self-Concept: Goal: Will verbalize positive feelings about self Outcome: Progressing Goal: Level of anxiety will decrease Outcome: Progressing   Problem: Education: Goal: Knowledge of disease or condition will improve Outcome: Progressing Goal: Understanding of discharge needs will improve Outcome: Progressing   Problem: Health Behavior/Discharge Planning: Goal: Ability to identify changes in lifestyle to reduce recurrence of condition will improve Outcome: Progressing Goal: Identification of resources available to assist in meeting health care needs will improve Outcome: Progressing   Problem: Physical Regulation: Goal: Complications related to the disease process, condition or treatment will be avoided or minimized Outcome: Progressing   Problem: Safety: Goal: Ability to remain free from injury will improve Outcome: Progressing   Problem: Education: Goal: Knowledge of General  Education information will improve Outcome: Progressing   Problem: Health Behavior/Discharge Planning: Goal: Ability to manage health-related needs will improve Outcome: Progressing   Problem: Coping: Goal: Level of anxiety will decrease Outcome: Progressing   Problem: Safety: Goal: Ability to remain free from injury will improve Outcome: Progressing   Problem: Spiritual Needs Goal: Ability to function at adequate level Outcome: Progressing

## 2018-01-07 NOTE — Progress Notes (Signed)
Pt reports he is having a lot of anxiety and racing thoughts at this time.  He is aware that he only has Vistaril prn and was given a dose at the beginning of the shift, shortly after 1900.  He denies SI/HI/AVH at this time.  Writer informed him that he had a sleep aid prn that may help with anxiety, but he declined the med, stating that he did not want a sleep aid.  Pt did not have any scheduled meds at bedtime.  He was pleasant and cooperative with Clinical research associatewriter.  Support and encouragement offered.  Discharge plans are in process.  Pt hopes to [facility] at discharge.  Safety maintained with q15 minute checks.

## 2018-01-07 NOTE — Progress Notes (Signed)
Patient was in bed resting with his eyes open upon approach. Patient appeared depressed and anxious. Denies SI HI AVH. Denies physical complaints, and denies questions or comments about plan of care. Patient pleasant and cooperative. Patient compliant with medication prescribed per MD.  Safety maintained with 15 minute checks as well as environmental checks. Will continue to monitor.

## 2018-01-07 NOTE — [facility] Group Notes (Signed)
[facility] Group Therapy 01/07/2018 1:15pm  Type of Therapy: Mental [facility] Presentation  Participation Level: Active  Participation Quality: Attentive  Affect: Appropriate  Cognitive: Oriented  Insight: Developing/Improving  Engagement in Therapy: Engaged  Modes of Intervention: Discussion, Education and Socialization  Summary of Progress/Problems: Mental Health Association (MHA) Speaker came to talk about his personal journey with mental health. The pt processed ways by which to relate to the speaker. MHA speaker provided handouts and educational information pertaining to groups and services offered by the MHA. Pt was engaged in speaker's presentation and was receptive to resources provided.    Artie Mcintyre S Denaly Gatling, LCSW 01/07/2018 3:19 PM  

## 2018-01-08 DIAGNOSIS — R45851 Suicidal ideations: Secondary | ICD-10-CM

## 2018-01-08 MED ORDER — RISPERIDONE 1 MG PO TABS
1.0000 mg | ORAL_TABLET | Freq: Every day | ORAL | Status: DC
  Administered 2018-01-08: 1 mg via ORAL
  Filled 2018-01-08 (×4): qty 1

## 2018-01-08 MED ORDER — RISPERIDONE 0.5 MG PO TABS
0.5000 mg | ORAL_TABLET | Freq: Every day | ORAL | Status: DC
  Administered 2018-01-09 – 2018-01-14 (×6): 0.5 mg via ORAL
  Filled 2018-01-08 (×8): qty 1

## 2018-01-08 NOTE — Plan of Care (Signed)
  Problem: Education: Goal: Knowledge of [facility] Education information/materials will improve Outcome: Progressing Goal: Emotional status will improve Outcome: Progressing Goal: Mental status will improve Outcome: Progressing Goal: Verbalization of understanding the information provided will improve Outcome: Progressing   Problem: Activity: Goal: Interest or engagement in activities will improve Outcome: Progressing Goal: Sleeping patterns will improve Outcome: Progressing   Problem: Coping: Goal: Ability to verbalize frustrations and anger appropriately will improve Outcome: Progressing Goal: Ability to demonstrate self-control will improve Outcome: Progressing   Problem: Health Behavior/Discharge Planning: Goal: Identification of resources available to assist in meeting health care needs will improve Outcome: Progressing Goal: Compliance with treatment plan for underlying cause of condition will improve Outcome: Progressing   Problem: Physical Regulation: Goal: Ability to maintain clinical measurements within normal limits will improve Outcome: Progressing   Problem: Safety: Goal: Periods of time without injury will increase Outcome: Progressing   Problem: Education: Goal: Ability to make informed decisions regarding treatment will improve Outcome: Progressing   Problem: Health Behavior/Discharge Planning: Goal: Identification of resources available to assist in meeting health care needs will improve Outcome: Progressing   Problem: Medication: Goal: Compliance with prescribed medication regimen will improve Outcome: Progressing   Problem: Self-Concept: Goal: Ability to disclose and discuss suicidal ideas will improve Outcome: Progressing   Problem: Education: Goal: Utilization of techniques to improve thought processes will improve Outcome: Progressing Goal: Knowledge of the prescribed therapeutic regimen will improve Outcome: Progressing    Problem: Activity: Goal: Interest or engagement in leisure activities will improve Outcome: Progressing Goal: Imbalance in normal sleep/wake cycle will improve Outcome: Progressing   Problem: Coping: Goal: Will verbalize feelings Outcome: Progressing   Problem: Health Behavior/Discharge Planning: Goal: Ability to make decisions will improve Outcome: Progressing Goal: Compliance with therapeutic regimen will improve Outcome: Progressing   Problem: Role Relationship: Goal: Will demonstrate positive changes in social behaviors and relationships Outcome: Progressing   Problem: Safety: Goal: Ability to disclose and discuss suicidal ideas will improve Outcome: Progressing Goal: Ability to identify and utilize support systems that promote safety will improve Outcome: Progressing   Problem: Self-Concept: Goal: Will verbalize positive feelings about self Outcome: Progressing Goal: Level of anxiety will decrease Outcome: Progressing   Problem: Education: Goal: Knowledge of disease or condition will improve Outcome: Progressing Goal: Understanding of discharge needs will improve Outcome: Progressing   Problem: Health Behavior/Discharge Planning: Goal: Ability to identify changes in lifestyle to reduce recurrence of condition will improve Outcome: Progressing Goal: Identification of resources available to assist in meeting health care needs will improve Outcome: Progressing   Problem: Physical Regulation: Goal: Complications related to the disease process, condition or treatment will be avoided or minimized Outcome: Progressing   Problem: Safety: Goal: Ability to remain free from injury will improve Outcome: Progressing   Problem: Education: Goal: Knowledge of [facility] Education information will improve Outcome: Progressing   Problem: Health Behavior/Discharge Planning: Goal: Ability to manage health-related needs will improve Outcome: Progressing   Problem:  Coping: Goal: Level of anxiety will decrease Outcome: Progressing   Problem: Safety: Goal: Ability to remain free from injury will improve Outcome: Progressing   Problem: Spiritual Needs Goal: Ability to function at adequate level Outcome: Progressing

## 2018-01-08 NOTE — Progress Notes (Signed)
[facility] Group Notes:  (Nursing/MHT/Case Management/Adjunct)  Date:  01/08/2018  Time:  4:00 PM  Type of Therapy:  Group Therapy  Participation Level:  Active  Participation Quality:  Appropriate, Sharing and Supportive  Affect:  Appropriate and Excited  Cognitive:  Appropriate  Insight:  Appropriate and Improving  Engagement in Group:  Developing/Improving, Improving and Supportive  Modes of Intervention:  Activity and Socialization  Summary of Progress/Problems: Patient was very active in group therapy today. Patient is improving on insight and attitude.   Rosaelena Kemnitz 01/08/2018, 6:02 PM 

## 2018-01-08 NOTE — Progress Notes (Signed)
Recreation Therapy Notes  Date: 7.10.19 Time: 0930 Location: [address] Dayroom  Group Topic: Stress Management  Goal Area(s) Addresses:  Patient will verbalize importance of using healthy stress management.  Patient will identify positive emotions associated with healthy stress management.   Intervention: Stress Management  Activity :  Guided Imagery.  LRT introduced the stress management technique of guided imagery.  LRT read [place] script to allow patients to visualize being outside on [place] bright summer day.  Patients were to follow along as script was read.  Education:  Stress Management, Discharge Planning.   Education Outcome: Acknowledges edcuation/In group clarification offered/Needs additional education  Clinical Observations/Feedback: Pt did not attend group.    Dominic [place], LRT/CTRS         Dominic Spencer 01/08/2018 11:48 AM 

## 2018-01-08 NOTE — Therapy (Signed)
Occupational Therapy Group Treatment Note  Date:  01/08/2018 Time:  1:22 PM  Group Topic/Focus:  Stress Management  Participation Level:  Active  Participation Quality:  Appropriate  Affect:  Depressed and Flat  Cognitive:  Appropriate  Insight: Improving  Engagement in Group:  Engaged  Modes of Intervention:  Activity, Discussion, Education and Socialization  Additional Comments:    S: "I do not deal with stress well"  O: Stress management group completed to use as productive coping strategy, to help mitigate maladaptive coping to integrate in functional BADL/IADL when reintegrating into community.  Stress management tools worksheet completed to identify negative coping mechanisms and their short and long term effects vs positive coping mechanisms with demonstration. Coping strategies taught include: relaxation based- deep breathing, counting to 10, taking a 1 minute vacation, acceptance, stress balls, relaxation audio/video, visual/mental imagery. Positive mental attitude- gratitude, acceptance, cognitive reframing, positive self talk, anger management. Adult coloring and relaxation tips worksheet given at end of session.   A: Pt presents to group late, flat and depressed affect- engaged and participatory throughout session. Pt identified majority of stress management to be maladaptive this date. He states he would like to try positive self talk and learning to let go of resentments this date. He plans to increase self talk with positive affirmations and journaling. He was eager to accept handouts at end of session to facilitate the carry over of newly learned skills.  P: Pt provided with education on stress management activities to implement into daily routine. Handouts given to facilitate carryover when reintegrating into community       [facility] Memorial HospitalKaylee Martine Bleecker, [place]MSOT, OTR/L  AvnetKaylee Libbey Duce 01/08/2018, 1:22 PM

## 2018-01-08 NOTE — [facility] Group Notes (Signed)
LCSW Group Therapy Note 01/08/2018 4:30 PM  Type of Therapy and Topic: Group Therapy: Overcoming Obstacles  Participation Level: Active  Description of Group:  In this group patients will be encouraged to explore what they see as obstacles to their own wellness and recovery. They will be guided to discuss their thoughts, feelings, and behaviors related to these obstacles. The group will process together ways to cope with barriers, with attention given to specific choices patients can make. Each patient will be challenged to identify changes they are motivated to make in order to overcome their obstacles. This group will be process-oriented, with patients participating in exploration of their own experiences as well as giving and receiving support and challenge from other group members.  Therapeutic Goals: 1. Patient will identify personal and current obstacles as they relate to admission. 2. Patient will identify barriers that currently interfere with their wellness or overcoming obstacles.  3. Patient will identify feelings, thought process and behaviors related to these barriers. 4. Patient will identify two changes they are willing to make to overcome these obstacles:   Summary of Patient Progress  Dominic Spencer was engaged and participated throughout the group session. Dominic Spencer reports that his main obstacle is drug usage and his lack of supports. Dominic [place] reports that he does not know how he plans to overcome this obstacle.    Therapeutic Modalities:  Cognitive Behavioral Therapy Solution Focused Therapy Motivational Interviewing Relapse Prevention Therapy   Alcario DroughtJolan Gionni Vaca LCSWA Clinical Social Worker

## 2018-01-08 NOTE — Progress Notes (Signed)
Patient pleasant yet anxious upon approach. Patient was complaining of anxiety rated 7 out of 10. PRN Vistaril given. No other complaints. Patient endorses SI with no plan and contracts for safety. Denies HI AVH. Patient is seen in the dayroom appropriately interacting with the patients and also attends groups.  Patient compliant with medications prescribed per MD.  Safety maintained with 15 minute checks as well as environmental checks. Will continue to monitor.
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
## 2018-01-08 NOTE — Progress Notes (Signed)
Pt invited, but declined NA group this evening.  

## 2018-01-09 MED ORDER — RISPERIDONE 2 MG PO TABS
2.0000 mg | ORAL_TABLET | Freq: Every day | ORAL | Status: DC
  Administered 2018-01-10 – 2018-01-13 (×4): 2 mg via ORAL
  Filled 2018-01-09 (×6): qty 1

## 2018-01-09 NOTE — Progress Notes (Signed)
Pt presents with a flat affect and depressed mood. Pt rated on his self inventory sheet: depression 2/10, anxiety 2/10, hopelessness 1/10, sleep-good and appetite -good. Pt endorses suicidal thoughts with no plan or intent. Pt denies any withdrawal symptoms. Pt reports decreased racing thoughts today. Pt d/c plan is to [facility]. Pt compliant with taking meds. No side effects to meds verbalized by pt.  Orders reviewed with pt. Verbal support provided. V/s assessed. Suicide risk assessment completed. Pt encouraged to complete suicide safety plan. Pt encouraged to attend groups. Environmental's completed every shift for safety. 15 minute checks performed for safety.  Pt compliant with tx plan. Pt stated goal "discharge with meaningful discharge plan".

## 2018-01-09 NOTE — B[facility] Group Notes (Signed)
Adult Psychoeducational Group Note  Date:  01/09/2018 Time:  11:12 PM  Group Topic/Focus:  Wrap-Up Group:   The focus of this group is to help patients review their daily goal of treatment and discuss progress on daily workbooks.  Participation Level:  Did Not Attend  Participation Quality:  Did not attend  Affect:  Did not attend  Cognitive:  Did not attend  Insight: None  Engagement in Group:  Did not attend  Modes of Intervention:  Discussion  Additional Comments:  Patient did not attend group.  Lyndee HensenGoins, Khaleah Duer R 01/09/2018, 11:12 PM

## 2018-01-09 NOTE — BHH Group Notes (Signed)
LCSW Group Therapy Note  01/09/2018 1:15pm  Type of Therapy/Topic:  Group Therapy:  Feelings about Diagnosis  Participation Level:  Active   Description of Group:   This group will allow patients to explore their thoughts and feelings about diagnoses they have received. Patients will be guided to explore their level of understanding and acceptance of these diagnoses. Facilitator will encourage patients to process their thoughts and feelings about the reactions of others to their diagnosis and will guide patients in identifying ways to discuss their diagnosis with significant others in their lives. This group will be process-oriented, with patients participating in exploration of their own experiences, giving and receiving support, and processing challenge from other group members.   Therapeutic Goals: 1. Patient will demonstrate understanding of diagnosis as evidenced by identifying two or more symptoms of the disorder 2. Patient will be able to express two feelings regarding the diagnosis 3. Patient will demonstrate their ability to communicate their needs through discussion and/or role play     Therapeutic Modalities:   Cognitive Behavioral Therapy Brief Therapy Feelings Identification    Rona RavensHeather S Tabatha Razzano, LCSW 01/09/2018 11:20 AM

## 2018-01-09 NOTE — Progress Notes (Signed)
Pt has been in bed most of the evening.  Writer was able to wake him to do the shift assessment, but conversation was minimal.  He states he is having some passive suicidal thoughts off and on throughout the day.  He denies HI/AVH.  He is frustrated with his racing thoughts, and says the NP told him that an order would be written to start Risperdal.  He says the previous RN told him that the order had not been put into the computer.  Writer assured him that if the NP had put in a note of the med addition, then the NP for the evening would be able to initiate the order which was the case, and pt was able to start the med tonight. Pt seemed to relax after being given the med at bedtime.  He voiced no other needs or concerns.  Support and encouragement offered.  Discharge plans are in process.  [facility].  Safety maintained with q15 minute checks.
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
## 2018-01-09 NOTE — BHH Group Notes (Signed)
Adult Psychoeducational Group Note  Date:  01/09/2018  Time: 4:00 PM  Group Topic/Focus: Barriers to Recovery  Participation Level:  Active  Participation Quality:  Appropriate and Attentive  Affect:  Appropriate  Cognitive:  Alert and Oriented  Insight: Improving  Engagement in Group:  Developing/Improving  Modes of Intervention:  Discussion and Education  Additional Comments:  Patient attended group, contributed positively to the discussion and was engaged/supportive of peers. Patient reports his barrier to recovery is also self-sabotage.   Dominic Spencer 01/09/2018 5:00 PM

## 2018-01-10 MED ORDER — IBUPROFEN 600 MG PO TABS
600.0000 mg | ORAL_TABLET | Freq: Four times a day (QID) | ORAL | Status: DC | PRN
  Administered 2018-01-10 – 2018-01-13 (×9): 600 mg via ORAL
  Filled 2018-01-10 (×8): qty 1

## 2018-01-10 NOTE — Progress Notes (Signed)
Pt hs been in the bed all evening.  He was awakened by Clinical research associatewriter at med pass time to inform him of his scheduled Risperdal.  He made a movement as if he was getting up to come to the med window, but then pulled the covers up and went back to sleep.  He says he is still having passive suicidal thoughts at this time, but can contract for safety.  He denies HI/AVH.  He intends to go to an [facility] at discharge, but does not feel he is ready for discharge at this time.  Pt voiced no needs or concerns.  Support and encouragement offered.  Discharge plans are in process.  Safety maintained with q15 minute checks.

## 2018-01-10 NOTE — Tx Team (Signed)
Interdisciplinary Treatment and Diagnostic Plan Update  01/10/2018 Time of Session: 1610[name] MRN: [id number]  Principal Diagnosis: MDD (major depressive disorder), severe (HCC)  Secondary Diagnoses: Principal Problem:   MDD (major depressive disorder), severe (HCC) Active Problems:   Substance induced mood disorder (HCC)   Cocaine use disorder, severe, dependence (HCC)   Methamphetamine use disorder, severe, dependence (HCC)   Opioid dependence with opioid-induced disorder (HCC)   Polysubstance dependence (HCC)   Current Medications:  Current Facility-Administered Medications  Medication Dose Route Frequency Provider Last Rate Last Dose  . acetaminophen (TYLENOL) tablet 650 mg  650 mg Oral Q6H PRN Money, Gerlene Burdock, FNP   650 mg at 01/04/18 2205  . alum & mag hydroxide-simeth (MAALOX/MYLANTA) 200-200-20 MG/5ML suspension 30 mL  30 mL Oral Q4H PRN Money, Feliz Beam B, FNP      . buprenorphine-naloxone (SUBOXONE) 8-2 mg per SL tablet 1 tablet  1 tablet Sublingual BID Money, Gerlene Burdock, FNP   1 tablet at 01/09/18 1656  . gabapentin (NEURONTIN) capsule 1,200 mg  1,200 mg Oral TID Money, Feliz Beam B, FNP   1,200 mg at 01/09/18 1656  . hydrOXYzine (ATARAX/VISTARIL) tablet 50 mg  50 mg Oral Q6H PRN Cobos, Rockey Situ, MD   50 mg at 01/09/18 1656  . magnesium hydroxide (MILK OF MAGNESIA) suspension 30 mL  30 mL Oral Daily PRN Money, Feliz Beam B, FNP      . nicotine (NICODERM CQ - dosed in mg/24 hours) patch 21 mg  21 mg Transdermal Daily Antonieta Pert, MD   21 mg at 01/09/18 9147  . risperiDONE (RISPERDAL) tablet 0.5 mg  0.5 mg Oral QAC breakfast Nira Conn A, NP   0.5 mg at 01/10/18 0626  . risperiDONE (RISPERDAL) tablet 2 mg  2 mg Oral QHS Nwoko, Agnes I, NP      . sertraline (ZOLOFT) tablet 75 mg  75 mg Oral Daily Cobos, Rockey Situ, MD   75 mg at 01/09/18 0821  . traZODone (DESYREL) tablet 100 mg  100 mg Oral QHS PRN Oneta Rack, NP       PTA Medications: Medications Prior to  Admission  Medication Sig Dispense Refill Last Dose  . benzonatate (TESSALON) 100 MG capsule Take 1 capsule (100 mg total) by mouth 3 (three) times daily as needed for cough. 21 capsule 0   . doxycycline (VIBRAMYCIN) 100 MG capsule Take 1 capsule (100 mg total) by mouth 2 (two) times daily. 14 capsule 0   . DULoxetine (CYMBALTA) 60 MG capsule Take 1 capsule (60 mg total) by mouth daily. 30 capsule 0 07/13/2017 at Unknown time  . gabapentin (NEURONTIN) 300 MG capsule Take 2 capsules (600 mg total) by mouth 3 (three) times daily. (Patient taking differently: Take 900 mg by mouth 3 (three) times daily. ) 180 capsule 0 07/13/2017 at Unknown time  . lisinopril (PRINIVIL,ZESTRIL) 10 MG tablet Take 1 tablet (10 mg total) by mouth daily. 30 tablet 0 07/13/2017 at Unknown time  . lithium carbonate (LITHOBID) 300 MG CR tablet Take 300-900 mg by mouth See admin instructions. 300mg  twice daily and 900mg  at bedtime   07/13/2017 at Unknown time  . oxyCODONE-acetaminophen (PERCOCET/ROXICET) 5-325 MG tablet Take 1 tablet by mouth every 6 (six) hours as needed for severe pain. 10 tablet 0   . propranolol (INDERAL) 20 MG tablet Take 20 mg by mouth 2 (two) times daily.   07/13/2017 at 1500  . QUEtiapine (SEROQUEL) 50 MG tablet Take 50-200 mg by mouth See  admin instructions. 50mg  twice daily and 200mg  at bedtime   07/13/2017 at Unknown time  . topiramate (TOPAMAX) 15 MG capsule Take 15 mg by mouth 2 (two) times daily.   07/13/2017 at Unknown time    Patient Stressors: Marital or family conflict Medication change or noncompliance Substance abuse  Patient Strengths: Ability for insight Average or above average intelligence Capable of independent living General fund of knowledge  Treatment Modalities: Medication Management, Group therapy, Case management,  1 to 1 session with clinician, Psychoeducation, Recreational therapy.   Physician Treatment Plan for Primary Diagnosis: MDD (major depressive disorder), severe  (HCC) Long Term Goal(s): Improvement in symptoms so as ready for discharge Improvement in symptoms so as ready for discharge   Short Term Goals: Ability to identify changes in lifestyle to reduce recurrence of condition will improve Ability to disclose and discuss suicidal ideas Ability to demonstrate self-control will improve Ability to identify and develop effective coping behaviors will improve Ability to identify and develop effective coping behaviors will improve Ability to maintain clinical measurements within normal limits will improve Compliance with prescribed medications will improve Ability to identify triggers associated with substance abuse/mental health issues will improve  Medication Management: Evaluate patient's response, side effects, and tolerance of medication regimen.  Therapeutic Interventions: 1 to 1 sessions, Unit Group sessions and Medication administration.  Evaluation of Outcomes: Progressing  Physician Treatment Plan for Secondary Diagnosis: Principal Problem:   MDD (major depressive disorder), severe (HCC) Active Problems:   Substance induced mood disorder (HCC)   Cocaine use disorder, severe, dependence (HCC)   Methamphetamine use disorder, severe, dependence (HCC)   Opioid dependence with opioid-induced disorder (HCC)   Polysubstance dependence (HCC)  Long Term Goal(s): Improvement in symptoms so as ready for discharge Improvement in symptoms so as ready for discharge   Short Term Goals: Ability to identify changes in lifestyle to reduce recurrence of condition will improve Ability to disclose and discuss suicidal ideas Ability to demonstrate self-control will improve Ability to identify and develop effective coping behaviors will improve Ability to identify and develop effective coping behaviors will improve Ability to maintain clinical measurements within normal limits will improve Compliance with prescribed medications will improve Ability to  identify triggers associated with substance abuse/mental health issues will improve     Medication Management: Evaluate patient's response, side effects, and tolerance of medication regimen.  Therapeutic Interventions: 1 to 1 sessions, Unit Group sessions and Medication administration.  Evaluation of Outcomes: Progressing   RN Treatment Plan for Primary Diagnosis: MDD (major depressive disorder), severe (HCC) Long Term Goal(s): Knowledge of disease and therapeutic regimen to maintain health will improve  Short Term Goals: Ability to remain free from injury will improve, Ability to verbalize frustration and anger appropriately will improve, Ability to demonstrate self-control and Ability to disclose and discuss suicidal ideas  Medication Management: RN will administer medications as ordered by provider, will assess and evaluate patient's response and provide education to patient for prescribed medication. RN will report any adverse and/or side effects to prescribing provider.  Therapeutic Interventions: 1 on 1 counseling sessions, Psychoeducation, Medication administration, Evaluate responses to treatment, Monitor vital signs and CBGs as ordered, Perform/monitor CIWA, COWS, AIMS and Fall Risk screenings as ordered, Perform wound care treatments as ordered.  Evaluation of Outcomes: Progressing   LCSW Treatment Plan for Primary Diagnosis: MDD (major depressive disorder), severe (HCC) Long Term Goal(s): Safe transition to appropriate next level of care at discharge, Engage patient in therapeutic group addressing interpersonal concerns.  Short  Term Goals: Engage patient in aftercare planning with referrals and resources, Facilitate patient progression through stages of change regarding substance use diagnoses and concerns and Identify triggers associated with mental health/substance abuse issues  Therapeutic Interventions: Assess for all discharge needs, 1 to 1 time with Social worker, Explore  available resources and support systems, Assess for adequacy in community support network, Educate family and significant other(s) on suicide prevention, Complete Psychosocial Assessment, Interpersonal group therapy.  Evaluation of Outcomes: Progressing   Progress in Treatment: Attending groups: Yes. Participating in groups: Yes. Taking medication as prescribed: Yes. Toleration medication: Yes. Family/Significant other contact made: SPE completed with pt; pt declined to consent to collateral contact.  Patient understands diagnosis: Yes. Discussing patient identified problems/goals with staff: Yes. Medical problems stabilized or resolved: Yes. Denies suicidal/homicidal ideation: Yes. Issues/concerns per patient self-inventory: No. Other: n/a   New problem(s) identified: No, Describe:  n/a  New Short Term/Long Term Goal(s): detox, medication management for mood stabilization; elimination of SI thoughts; development of comprehensive mental wellness/sobriety plan.   Patient Goals:  "to get help with depression and suicidal thoughts and to get back on my medicine."   Discharge Plan or Barriers: Pt plans to return to [facility]. Pt given [employer] as well. He goes to [facility] currentl for [employer]. He is interested in medication management in [place] a d possibly returning to [facility] halfway house. [facility] pamphlet, Mobile Crisis information, and AA/NA information provided to patient for additional community support and resources.   Reason for Continuation of Hospitalization: Anxiety Depression Medication stabilization Suicidal ideation  Estimated Length of Stay: Monday, 01/13/18  Attendees: Patient: Dominic Spencer  01/10/2018 9:18 AM  Physician: Dr. Jama Flavors MD; Dr. Viviano Simas MD 01/10/2018 9:18 AM  Nursing: Marchelle Folks RN; Patty RN 01/10/2018 9:18 AM  RN Care Manager:x 01/10/2018 9:18 AM  Social Worker: Corrie Mckusick LCSW 01/10/2018 9:18 AM   Recreational Therapist: x 01/10/2018 9:18 AM  Other: Gilda Crease NP; Armandina Stammer NP  01/10/2018 9:18 AM  Other:  01/10/2018 9:18 AM  Other: 01/10/2018 9:18 AM    Scribe for Treatment Team: Rona Ravens, LCSW 01/10/2018 9:18 AM

## 2018-01-10 NOTE — Plan of Care (Signed)
  Problem: Education: Goal: Emotional status will improve Outcome: Progressing   

## 2018-01-10 NOTE — Progress Notes (Signed)
Patient did attend the evening speaker AA meeting.  

## 2018-01-10 NOTE — [facility] Group Notes (Signed)
LCSW Group Therapy Note 01/10/2018 3:29 PM  Type of Therapy and Topic: Group Therapy: Avoiding Self-Sabotaging and Enabling Behaviors  Participation Level: Minimal  Description of Group:  In this group, patients will learn how to identify obstacles, self-sabotaging and enabling behaviors, as well as: what are they, why do we do them and what needs these behaviors meet. Discuss unhealthy relationships and how to have positive healthy boundaries with those that sabotage and enable. Explore aspects of self-sabotage and enabling in yourself and how to limit these self-destructive behaviors in everyday life.  Therapeutic Goals: 1. Patient will identify one obstacle that relates to self-sabotage and enabling behaviors 2. Patient will identify one personal self-sabotaging or enabling behavior they did prior to admission 3. Patient will state a plan to change the above identified behavior 4. Patient will demonstrate ability to communicate their needs through discussion and/or role play.   Summary of Patient Progress:  Dominic Spencer was attentive during the group session, however he chose not to contribute to the group's discussion.     Therapeutic Modalities:  Cognitive Behavioral Therapy Person-Centered Therapy Motivational Interviewing   [name] LCSWA Clinical Social Worker
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
## 2018-01-10 NOTE — Progress Notes (Signed)
Recreation Therapy Notes  Date: 7.12.19 Time: 0930 Location: 300 Hall Dayroom  Group Topic: Stress Management  Goal Area(s) Addresses:  Patient will verbalize importance of using healthy stress management.  Patient will identify positive emotions associated with healthy stress management.   Intervention: Stress Management  Activity :  Meditation.  LRT introduced the stress management technique of meditation.  LRT played Spencer meditation on being resilient.  Patients were to listen to the meditation and follow along as meditation played.  Education:  Stress Management, Discharge Planning.   Education Outcome: Acknowledges edcuation/In group clarification offered/Needs additional education  Clinical Observations/Feedback: Pt did not attend group.    Dominic Spencer, LRT/CTRS         Dominic Spencer 01/10/2018 11:49 AM 

## 2018-01-11 NOTE — Progress Notes (Signed)
D Pt is observed OOB UAL on the 300 hall tolerated well. Writer observed him socializing with his peers, attending his groups, talking to his peers and the staff. He is pleasant, cooperative, talkative and cordial. He takes his scheduled meds as ordered.     A He completed his daily assessment and on this he wrote he has experienced SI today . He verbally contracted with writer to stay safe today. HE rates his depression, hopelessness and axneity " 7/7/7", respectively.     R Safety si in place.
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
## 2018-01-11 NOTE — Plan of Care (Signed)
  Problem: Education: Goal: Emotional status will improve Outcome: Progressing   

## 2018-01-11 NOTE — [facility] Group Notes (Signed)
LCSW Group Therapy Note  01/11/2018   10:00--11:00am   Type of Therapy and Topic:  Group Therapy: Anger Cues and Responses  Participation Level:  Did Not Attend   Description of Group:   In this group, patients learned how to recognize the physical, cognitive, emotional, and behavioral responses they have to anger-provoking situations.  They identified a recent time they became angry and how they reacted.  They analyzed how their reaction was possibly beneficial and how it was possibly unhelpful.  The group discussed a variety of healthier coping skills that could help with such a situation in the future.  Deep breathing was practiced briefly.  Therapeutic Goals: 1. Patients will remember their last incident of anger and how they felt emotionally and physically, what their thoughts were at the time, and how they behaved. 2. Patients will identify how their behavior at that time worked for them, as well as how it worked against them. 3. Patients will explore possible new behaviors to use in future anger situations. 4. Patients will learn that anger itself is normal and cannot be eliminated, and that healthier reactions can assist with resolving conflict rather than worsening situations.  Summary of Patient Progress:  The patient patient did not attend group.  Therapeutic Modalities:   Cognitive Behavioral Therapy  Evorn Gongonnie D Gracee Ratterree

## 2018-01-12 MED ORDER — SERTRALINE HCL 100 MG PO TABS
100.0000 mg | ORAL_TABLET | Freq: Every day | ORAL | Status: DC
  Administered 2018-01-13 – 2018-01-14 (×2): 100 mg via ORAL
  Filled 2018-01-12 (×3): qty 1

## 2018-01-12 NOTE — [facility] Group Notes (Signed)
[facility] LCSW Group Therapy Note  Date/Time:  01/12/2018 9:00-10:00 or 10:00-11:00AM  Type of Therapy and Topic:  Group Therapy:  Healthy and Unhealthy Supports  Participation Level:  Active   Description of Group:  Patients in this group were introduced to the idea of adding a variety of healthy supports to address the various needs in their lives.Patients discussed what additional healthy supports could be helpful in their recovery and wellness after discharge in order to prevent future hospitalizations.   An emphasis was placed on using counselor, doctor, therapy groups, 12-step groups, and problem-specific support groups to expand supports.  They also worked as a group on developing a specific plan for several patients to deal with unhealthy supports through boundary-setting, psychoeducation with loved ones, and even termination of relationships.   Therapeutic Goals:   1)  discuss importance of adding supports to stay well once out of the hospital  2)  compare healthy versus unhealthy supports and identify some examples of each  3)  generate ideas and descriptions of healthy supports that can be added  4)  offer mutual support about how to address unhealthy supports  5)  encourage active participation in and adherence to discharge plan    Summary of Patient Progress:  The patient stated that there  Are no current healthy supports in his life  However he intends to move to recovery house where he will have the the necessary supports in place to aid him in his recovery journey.  Therapeutic Modalities:   Motivational Interviewing Brief Solution-Focused Therapy  Evorn Gongonnie D. Corrigan Kretschmer, LCSW

## 2018-01-12 NOTE — Progress Notes (Signed)
Patient ID: Royetta CrochetSteven G Napoleon, male   DOB: 11/15/1970, 47 y.o.   MRN: [id number] D: Patient presents with flat affect and depressed mood.  Pt reports he is doing and tolerating medications without any side effect. Pt denies SI/HI/AVH and pain. Pt attended and participated in evening wrap up group. Cooperative with assessment.  A: Medications administered as prescribed. Support and encouragement provided as needed. Pt encouraged to discuss feelings and come to staff with any question or concerns.  R: Patient remains safe and complaint with medications.

## 2018-01-12 NOTE — Plan of Care (Signed)
  Problem: Education: Goal: Emotional status will improve 01/12/2018 0857 by Rutherford Guysuke, Ashely Joshua L, RN Outcome: Progressing 01/11/2018 2047 by Rutherford Guysuke, Desree Leap L, RN Outcome: Progressing

## 2018-01-12 NOTE — Progress Notes (Signed)
D patient is seen [facility] on the [facility] hall today. His affect is flat, depressed and he is upset with the thought of being discharged this week. He says " I have insurance,,,why do you wan tto discharge me?"     A He completed his daily assessment and on this he wrote he has experienced SI today ( but he is able to contract verbally with this writer to not hurt herself today). He rated his depression, hopelessness and anxeity " 6/7/8", respectively.      R Safety is in place.
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
## 2018-01-12 NOTE — Progress Notes (Signed)
Nursing Progress Note: 7p-7a D: Pt currently presents with a anxious/animated/euthymic affect and behavior. Pt states "I feel fantastic." Interacting appropraitely with the milieu. Pt reports good sleep during the previous night with current medication regimen. Pt did attend wrap-up group.  A: Pt provided with medications per providers orders. Pt's labs and vitals were monitored throughout the night. Pt supported emotionally and encouraged to express concerns and questions. Pt educated on medications.  R: Pt's safety ensured with 15 minute and environmental checks. Pt currently denies SI, HI, and AVH. Pt verbally contracts to seek staff if SI,HI, or AVH occurs and to consult with staff before acting on any harmful thoughts. Will continue to monitor.

## 2018-01-13 MED ORDER — GABAPENTIN 300 MG PO CAPS
900.0000 mg | ORAL_CAPSULE | Freq: Three times a day (TID) | ORAL | Status: DC
  Administered 2018-01-14 (×2): 900 mg via ORAL
  Filled 2018-01-13 (×4): qty 3

## 2018-01-13 MED ORDER — TRAZODONE HCL 50 MG PO TABS
50.0000 mg | ORAL_TABLET | Freq: Every evening | ORAL | Status: DC | PRN
  Filled 2018-01-13: qty 4
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
## 2018-01-13 NOTE — Progress Notes (Signed)
D) pt. Affect and mood appears superficially pleasant.  Pt. Reports continued issue with anxiety and c/o right "quad" pain due to a muscle "strain" from playing in the gym.  Pt. Received PRN medication and comfort measures for pain and was noted bouncing about in the hall, talking with peers and getting ready to go to the gym at 11 am.  Pt. Reported pain was "better" at that time.  Pt. Also noted positive for tremors and states this is "new" and wondered aloud if it was the "risperidone".  Pt. Reports anticipated d/c and states he will be going to [facility] home. A) Pt. Offered support and MD notified of symptoms. Medicated per order.  R) Pt. Receptive and offers no further c/o at this time.

## 2018-01-13 NOTE — Progress Notes (Signed)
Recreation Therapy Notes  Date: 7.15.19 Time: 0930 Location: [address] Dayroom  Group Topic: Stress Management  Goal Area(s) Addresses:  Patient will verbalize importance of using healthy stress management.  Patient will identify positive emotions associated with healthy stress management.   Intervention: Stress Management  Activity :  Guided Imagery.  LRT introduced the stress management technique of guided imagery.  LRT read a script that allowed patients to picture their peaceful place.  Patients were to follow along as the script was read to engage in the activity.  Education:  Stress Management, Discharge Planning.   Education Outcome: Acknowledges edcuation/In group clarification offered/Needs additional education  Clinical Observations/Feedback: Pt did not attend group.      Zyria Fiscus, LRT/CTRS         Nabil Bubolz A 01/13/2018 12:26 PM 

## 2018-01-13 NOTE — BHH Group Notes (Signed)
LCSW Group Therapy Note   01/13/2018 1:15pm   Type of Therapy and Topic:  Group Therapy:  Overcoming Obstacles   Participation Level:  Active   Description of Group:    In this group patients will be encouraged to explore what they see as obstacles to their own wellness and recovery. They will be guided to discuss their thoughts, feelings, and behaviors related to these obstacles. The group will process together ways to cope with barriers, with attention given to specific choices patients can make. Each patient will be challenged to identify changes they are motivated to make in order to overcome their obstacles. This group will be process-oriented, with patients participating in exploration of their own experiences as well as giving and receiving support and challenge from other group members.   Therapeutic Goals: 1. Patient will identify personal and current obstacles as they relate to admission. 2. Patient will identify barriers that currently interfere with their wellness or overcoming obstacles.  3. Patient will identify feelings, thought process and behaviors related to these barriers. 4. Patient will identify two changes they are willing to make to overcome these obstacles:      Summary of Patient Progress   [name] was attentive and engaged during today's processing group. He shared that his biggest obstacle is overcoming depression/SI thoughts and remaining sober. He plans [facility]enter [facility] at discharge and is looking forward to discharge tomorrow. He states he feels better and ready to start life again. [name] continues to make progress in the group setting.    Therapeutic Modalities:   Cognitive Behavioral Therapy Solution Focused Therapy Motivational Interviewing Relapse Prevention Therapy  Rona RavensHeather S Kiyoko Mcguirt, LCSW 01/13/2018 1:12 PM

## 2018-01-13 NOTE — Progress Notes (Signed)
Nursing Progress Note: 7p-7a D: Pt currently presents with a anxious/animated/silly/flirtatious affect and behavior. Interacting appropriately with the milieu. Pt reports good sleep during the previous night with current medication regimen. Pt did attend wrap-up group.  A: Pt provided with medications per providers orders. Pt's labs and vitals were monitored throughout the night. Pt supported emotionally and encouraged to express concerns and questions. Pt educated on medications.  R: Pt's safety ensured with 15 minute and environmental checks. Pt currently denies SI, HI, and AVH. Pt verbally contracts to seek staff if SI,HI, or AVH occurs and to consult with staff before acting on any harmful thoughts. Will continue to monitor.

## 2018-01-13 NOTE — Progress Notes (Addendum)
  [facility] :  Will you be returning to the same living situation after discharge:  Yes,  [facility] At discharge, do you have transportation home?: Yes,  bus --discharge rescheduled for Tuesday, 7/16 Do you have the ability to pay for your medications: Yes,  [employer] Medicare  Release of information consent forms completed and submitted to medical records by CSW.  Patient to Follow up at: Follow-up Information    [facility]. Go on 01/14/2018.   Specialty:  Behavioral Health Why:  Appointment for medication management and therapy services is Tuesday, 01/14/18 at 8:00am. Please be sure to bring your Photo ID, list of medications and any discharge paperwork from this hospitalization.  Contact information: [address] [place] [address] [phone]           Next level of care provider [facility] Link:no  Safety Planning and Suicide Prevention discussed: Yes,  SPE completed with pt; pt declined to consent to collateral contact. SPI pamphlet and Mobile Crisis information provided to pt.   Have you used any form of tobacco in the last 30 days? (Cigarettes, Smokeless Tobacco, Cigars, and/or Pipes): Yes  Has patient been referred to the Quitline?: Patient refused referral  Patient has been referred for addiction treatment: Yes  Rona RavensHeather S Saksham Akkerman, LCSW 01/13/2018, 9:30 AM

## 2018-01-14 MED ORDER — RISPERIDONE 0.5 MG PO TABS: 0.5000 mg | ORAL_TABLET | Freq: Every day | ORAL | 0 refills | Status: DC

## 2018-01-14 MED ORDER — SERTRALINE HCL 100 MG PO TABS: 100.0000 mg | ORAL_TABLET | Freq: Every day | ORAL | 0 refills | Status: DC

## 2018-01-14 MED ORDER — GABAPENTIN 300 MG PO CAPS: 900.0000 mg | ORAL_CAPSULE | Freq: Three times a day (TID) | ORAL | 0 refills | Status: DC

## 2018-01-14 MED ORDER — RISPERIDONE 2 MG PO TABS: 2.0000 mg | ORAL_TABLET | Freq: Every day | ORAL | 0 refills | Status: DC

## 2018-01-14 MED ORDER — TRAZODONE HCL 50 MG PO TABS: 50.0000 mg | ORAL_TABLET | Freq: Every evening | ORAL | 0 refills | Status: DC | PRN

## 2018-01-14 MED ORDER — HYDROXYZINE HCL 50 MG PO TABS: 50.0000 mg | ORAL_TABLET | Freq: Four times a day (QID) | ORAL | 0 refills | Status: DC | PRN

## 2018-01-14 MED ORDER — NICOTINE 21 MG/24HR TD PT24: 21.0000 mg | MEDICATED_PATCH | Freq: Every day | TRANSDERMAL | 0 refills | Status: DC
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
## 2018-01-14 NOTE — Progress Notes (Signed)
  [facility]  Will you be returning to the same living situation after discharge:  Yes,  [facility] At discharge, do you have transportation home?: Yes,  bus Do you have the ability to pay for your medications: Yes,  [employer] Medicare  Release of information consent forms completed and submitted to medical records by CSW.  Patient to Follow up at: Follow-up Information    [facility] Follow up on 01/17/2018.   Specialty:  Behavioral Health Why:  Hospital follow-up appt on Friday, 7/19 at 8:00AM.  Please be sure to bring your Photo ID, list of medications and any discharge paperwork from this hospitalization.  Contact information: [address] [place] [address] [phone]           Next level of care provider has access to [facility] Link:no  Safety Planning and Suicide Prevention discussed: Yes,  SPE completed with pt; pt declined to consent to collateral contact. SPI pamphlet and mobile crisis information provided.   Have you used any form of tobacco in the last 30 days? (Cigarettes, Smokeless Tobacco, Cigars, and/or Pipes): Yes  Has patient been referred to the Quitline?: Patient refused referral  Patient has been referred for addiction treatment: Yes  Rona RavensHeather S Adora Yeh, LCSW 01/14/2018, 8:50 AM

## 2018-01-14 NOTE — Progress Notes (Addendum)
D: Patient presents calm and cooperative. Slept fair last night, and did not request medication to help with sleep. Appetite is fair, energy normal and concentration good. He rates his depression, sense of hopelessness and anxiety 5/10. He denies withdrawal symptoms. Complains of pain 5/10 on self inventory, but declined medication. Pt A & O X 3. Denies SI, HI, AVH and pain at this time. D/C home as ordered. [facility].  A: D/C instructions reviewed with pt including prescriptions, medication samples and follow up appointment, compliance encouraged. All belongings from locker given to pt at time of departure. Scheduled and PRN medications given with verbal education and effects monitored. Safety checks maintained without incident till time of d/c.  R: Pt receptive to care. Compliant with medications when offered. Denies adverse drug reactions when assessed. Verbalized understanding related to d/c instructions. Signed belonging sheet in agreement with items received from locker. Ambulatory with a steady gait. Appears to be in no physical distress at time of departure.

## 2018-01-14 NOTE — BHH Suicide Risk Assessment (Addendum)
[facility]   Principal Problem: MDD (major depressive disorder), severe [facility](HCC) Discharge Diagnoses:  Patient Active Problem List   Diagnosis Date Noted  . Substance induced mood disorder (HCC) [F19.94]   . Cocaine use disorder, severe, dependence (HCC) [F14.20]   . 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 Polysubstance dependence (HCC) [F19.20]   . MDD (major depressive disorder), severe (HCC) [F32.2] 01/03/2018  . Acute pain of left shoulder [M25.512]   . MDD (major depressive disorder), recurrent severe, without psychosis (HCC) [F33.2] 03/10/2017    Total Time spent with patient: 30 minutes  Musculoskeletal: Strength & Muscle Tone: within normal limits Gait & Station: normal Patient leans: N/A  Psychiatric Specialty Exam: ROS denies headache, no chest pain, no shortness of breath, no vomiting , no fever, no chills   Blood pressure (!) 119/102, pulse 71, temperature 97.7 F (36.5 C), temperature source Oral, resp. rate 20, height 6\' 2"  (1.88 m), weight 88.9 kg (196 lb).Body mass index is 25.16 kg/m.  General Appearance: improved grooming   Eye Contact::  Good  Speech:  Normal Rate409  Volume:  Normal  Mood:  reports improved mood, states " this is the best I have felt in a long time"  Affect:  slightly anxious, fully reactive   Thought Process:  Linear and Descriptions of Associations: Intact  Orientation:  Other:  fully alert and attentive   Thought Content:  no hallucinations, no delusions, not internally preoccupied   Suicidal Thoughts:  No- denies any suicidal or self injurious ideations, denies any homicidal or violent ideations  Homicidal Thoughts:  No  Memory:  recent and remote grossly intact   Judgement:  Other:  improving   Insight:  improving   Psychomotor Activity:  Normal- no psychomotor agitation  Concentration:  Good  Recall:  Good  Fund  of Knowledge:Good  Language: Good  Akathisia:  Negative  Handed:  Right  AIMS (if indicated):     Assets:  Communication Skills Desire for Improvement Resilience  Sleep:  Number of Hours: 5.75  Cognition: WNL  ADL's:  Intact   Mental Status Per Nursing Assessment::   On Admission:  Suicidal ideation indicated by patient, Self-harm thoughts  Demographic Factors:  47 year old male   Loss Factors: Relationship, housing stressors, substance abuse   Historical Factors: History of depression, history of substance abuse ( cocaine, methamphetamine).  Risk Reduction Factors:   Positive coping skills or problem solving skills  Continued Clinical Symptoms:  At this time patient is alert, attentive, well related, describes mood as much improved compared to admission, affect presents fuller in range, no thought disorder, no suicidal or self injurious ideations, no homicidal or violent ideations, no hallucinations, no delusions, not internally preoccupied, future oriented. Currently denies medication side effects. No agitated or disruptive behavior on unit, presents pleasant on approach.   Cognitive Features That Contribute To Risk:  No gross cognitive deficits noted upon discharge. Is alert , attentive, and oriented x 3   Suicide Risk:  Mild:  Suicidal ideation of limited frequency, intensity, duration, and specificity.  There are no identifiable plans, no associated intent, mild dysphoria and related symptoms, good self-control (both objective and subjective assessment), few other risk factors, and identifiable protective factors, including available and accessible social support.  Follow-up Information    [facility] Follow up on 01/17/2018.   Specialty:  Behavioral Health Why:  Hospital follow-up appt on Friday, 7/19  at 8:00AM.  Please be sure to bring your Photo ID, list of medications and any discharge paperwork from this hospitalization.  Contact information: [address] [place] [address] [phone]           Plan Of Care/Follow-up recommendations:  Activity:  as tolerated Diet:  regular Tests:  NA Other:  see below  Patient is expressing readiness for discharge, plans to go to an [facility] setting, and plans to follow up at [facility]. Plans to continue outpatient medical management and Buprenorphine management through [facility] in [facility] A Cobos, MD 01/14/2018, 11:08 AM

## 2018-01-14 NOTE — Progress Notes (Signed)
Patient ID: Royetta CrochetSteven G Streets, male   DOB: 05/30/1971, 47 y.o.   MRN: [id number] Patient discharged to a sober living house for continued recovery.  Upon discharged patient denies SI, HI and AVH.  Patient was noted to be in a bright mood and eager to discharge.  Patient acknowledged understanding of all discharge instructions and receipt of personal belongings.

## 2018-01-15 DIAGNOSIS — F1994 Other psychoactive substance use, unspecified with psychoactive substance-induced mood disorder: Secondary | ICD-10-CM | POA: Diagnosis not present

## 2018-01-15 DIAGNOSIS — Z79891 Long term (current) use of opiate analgesic: Secondary | ICD-10-CM | POA: Diagnosis not present

## 2018-01-15 DIAGNOSIS — Z6824 Body mass index (BMI) 24.0-24.9, adult: Secondary | ICD-10-CM | POA: Diagnosis not present

## 2018-01-15 DIAGNOSIS — F1123 Opioid dependence with withdrawal: Secondary | ICD-10-CM | POA: Diagnosis not present

## 2018-01-15 DIAGNOSIS — F1120 Opioid dependence, uncomplicated: Secondary | ICD-10-CM | POA: Diagnosis not present

## 2018-01-15 DIAGNOSIS — F1910 Other psychoactive substance abuse, uncomplicated: Secondary | ICD-10-CM | POA: Diagnosis not present

## 2018-01-17 ENCOUNTER — Emergency Department (HOSPITAL_COMMUNITY)
Admission: EM | Admit: 2018-01-17 | Discharge: 2018-01-18 | Disposition: A | Discharge status: Home / Self Care | Payer: Medicare HMO | Attending: Emergency Medicine | Admitting: Emergency Medicine

## 2018-01-17 ENCOUNTER — Other Ambulatory Visit: Payer: Self-pay

## 2018-01-17 ENCOUNTER — Encounter (HOSPITAL_COMMUNITY): Payer: Self-pay

## 2018-01-17 ENCOUNTER — Emergency Department (HOSPITAL_COMMUNITY): Payer: Medicare HMO

## 2018-01-17 DIAGNOSIS — F17210 Nicotine dependence, cigarettes, uncomplicated: Secondary | ICD-10-CM | POA: Diagnosis not present

## 2018-01-17 DIAGNOSIS — M79671 Pain in right foot: Secondary | ICD-10-CM | POA: Diagnosis not present

## 2018-01-17 DIAGNOSIS — F332 Major depressive disorder, recurrent severe without psychotic features: Secondary | ICD-10-CM | POA: Diagnosis not present

## 2018-01-17 DIAGNOSIS — F329 Major depressive disorder, single episode, unspecified: Secondary | ICD-10-CM | POA: Diagnosis present

## 2018-01-17 DIAGNOSIS — F1120 Opioid dependence, uncomplicated: Secondary | ICD-10-CM | POA: Diagnosis not present

## 2018-01-17 DIAGNOSIS — Z79899 Other long term (current) drug therapy: Secondary | ICD-10-CM | POA: Diagnosis not present

## 2018-01-17 DIAGNOSIS — R45851 Suicidal ideations: Secondary | ICD-10-CM | POA: Insufficient documentation

## 2018-01-17 DIAGNOSIS — F1420 Cocaine dependence, uncomplicated: Secondary | ICD-10-CM | POA: Diagnosis not present

## 2018-01-17 HISTORY — DX: Suicidal ideations: R45.851

## 2018-01-17 LAB — CBC
HEMATOCRIT: 44.9 % (ref 39.0–52.0)
Hemoglobin: 15 g/dL (ref 13.0–17.0)
MCH: 30.7 pg (ref 26.0–34.0)
MCHC: 33.4 g/dL (ref 30.0–36.0)
MCV: 92 fL (ref 78.0–100.0)
Platelets: 287 10*3/uL (ref 150–400)
RBC: 4.88 MIL/uL (ref 4.22–5.81)
RDW: 11.3 % — ABNORMAL LOW (ref 11.5–15.5)
WBC: 6 10*3/uL (ref 4.0–10.5)

## 2018-01-17 LAB — COMPREHENSIVE METABOLIC PANEL
ALBUMIN: 4.5 g/dL (ref 3.5–5.0)
ALK PHOS: 117 U/L (ref 38–126)
ALT: 61 U/L — ABNORMAL HIGH (ref 0–44)
ANION GAP: 9 (ref 5–15)
AST: 44 U/L — ABNORMAL HIGH (ref 15–41)
BUN: 17 mg/dL (ref 6–20)
CALCIUM: 9.8 mg/dL (ref 8.9–10.3)
CO2: 28 mmol/L (ref 22–32)
Chloride: 105 mmol/L (ref 98–111)
Creatinine, Ser: 0.86 mg/dL (ref 0.61–1.24)
GFR calc Af Amer: >60 mL/min (ref >60)
GFR calc non Af Amer: >60 mL/min (ref >60)
GLUCOSE: 103 mg/dL — AB (ref 70–99)
Potassium: 4 mmol/L (ref 3.5–5.1)
SODIUM: 142 mmol/L (ref 135–145)
Total Bilirubin: 1.4 mg/dL — ABNORMAL HIGH (ref 0.3–1.2)
Total Protein: 7.2 g/dL (ref 6.5–8.1)

## 2018-01-17 LAB — RAPID URINE DRUG SCREEN, HOSP PERFORMED
AMPHETAMINES: NOT DETECTED
BARBITURATES: NOT DETECTED
BENZODIAZEPINES: NOT DETECTED
Cocaine: NOT DETECTED
Opiates: NOT DETECTED
TETRAHYDROCANNABINOL: NOT DETECTED

## 2018-01-17 LAB — SALICYLATE LEVEL: Salicylate Lvl: <7 mg/dL (ref 2.8–30.0)

## 2018-01-17 LAB — ACETAMINOPHEN LEVEL

## 2018-01-17 LAB — ETHANOL: Alcohol, Ethyl (B): <10 mg/dL (ref <10)

## 2018-01-17 MED ORDER — ONDANSETRON HCL 4 MG PO TABS: 4.0000 mg | ORAL_TABLET | Freq: Three times a day (TID) | ORAL | Status: DC | PRN

## 2018-01-17 MED ORDER — PROPRANOLOL HCL 40 MG PO TABS
20.0000 mg | ORAL_TABLET | Freq: Two times a day (BID) | ORAL | Status: DC
  Administered 2018-01-17 (×2): 20 mg via ORAL
  Filled 2018-01-17 (×2): qty 1

## 2018-01-17 MED ORDER — ALUM & MAG HYDROXIDE-SIMETH 200-200-20 MG/5ML PO SUSP: 30.0000 mL | Freq: Four times a day (QID) | ORAL | Status: DC | PRN

## 2018-01-17 MED ORDER — IBUPROFEN 400 MG PO TABS: 600.0000 mg | ORAL_TABLET | Freq: Three times a day (TID) | ORAL | Status: DC | PRN

## 2018-01-17 MED ORDER — LISINOPRIL 10 MG PO TABS
10.0000 mg | ORAL_TABLET | Freq: Every day | ORAL | Status: DC
  Administered 2018-01-17: 10 mg via ORAL
  Filled 2018-01-17: qty 1

## 2018-01-17 MED ORDER — GABAPENTIN 300 MG PO CAPS
900.0000 mg | ORAL_CAPSULE | Freq: Three times a day (TID) | ORAL | Status: DC
  Administered 2018-01-17 – 2018-01-18 (×3): 900 mg via ORAL
  Filled 2018-01-17 (×3): qty 3

## 2018-01-17 MED ORDER — NICOTINE 21 MG/24HR TD PT24
21.0000 mg | MEDICATED_PATCH | Freq: Every day | TRANSDERMAL | Status: DC
  Administered 2018-01-17: 21 mg via TRANSDERMAL
  Filled 2018-01-17: qty 1

## 2018-01-17 NOTE — ED Triage Notes (Addendum)
Pt endorses suicidal thoughts with plan to cut his wrists. Has hx of same. Pt has hx of meth drug abuse and "pain pills" and living in a sober house for rehab.

## 2018-01-17 NOTE — ED Notes (Signed)
Pt reports that he had an argument with the people he was living with so he left. Pt also states that he needs his Suboxone, reports to PA that he had taken this morning. Made PA aware of pt request for medication.

## 2018-01-17 NOTE — ED Notes (Signed)
Patient has been requesting his Saboxone; RN spoke with Dr.Teglar who states specific medication is not given in the ED, and does not feel comfortable administering; [facility] for what they would prefer to give patient-Monique,RN
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
## 2018-01-17 NOTE — ED Notes (Signed)
Patient transported to X-ray 
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
## 2018-01-17 NOTE — ED Notes (Signed)
Pt has 6 bags of clothing including 4 trash bags filled with clothing, 1 black book bag, and a pt belongings bag filled clothing, placed in Lockers #1 and #4

## 2018-01-18 ENCOUNTER — Other Ambulatory Visit: Payer: Self-pay

## 2018-01-18 DIAGNOSIS — F332 Major depressive disorder, recurrent severe without psychotic features: Secondary | ICD-10-CM | POA: Diagnosis not present

## 2018-01-18 NOTE — ED Notes (Addendum)
Regular Diet was ordered for Lunch. 

## 2018-01-18 NOTE — ED Notes (Signed)
Pt aware of d/c plan - [facility]. Pt voiced agreement w/tx plan.
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
## 2018-01-18 NOTE — ED Notes (Signed)
Will D/C pt when [facility] re: [facility].

## 2018-01-18 NOTE — ED Notes (Signed)
Patient has been asleep in the past hour - respirations are unlabored.

## 2018-01-18 NOTE — ED Notes (Signed)
Patient is currently speaking to Nurse Practitioner via telepysch in regards to discharge disposition.  Pt is alert - displays coherent and fluid thoughts.

## 2018-01-18 NOTE — Progress Notes (Signed)
LCSW faxed patient resources for [facility] (services information for residential, address, and phone number) to his nurse at [phone].   Moss McKy-Sha Koda Routon MSW, LCSW, LCAS Clinical Social Worker 01/18/2018 9:30 AM

## 2018-01-18 NOTE — ED Notes (Addendum)
ALL belongings - 6 labeled trash bags of belongings  NO Valuables noted - Pt signed verifying all items present. [facility] given [facility] information. [place] bus pass and 2 PART bus passes given.

## 2018-01-18 NOTE — ED Notes (Signed)
Patient was given a Ginger Ale W/ a Cup of ice.

## 2018-01-18 NOTE — ED Notes (Signed)
Nursing assessment was performed at 0800 this am - see 0945 documentation.  Pt states he is not SI if he has a place to stay.

## 2018-02-13 DIAGNOSIS — F1010 Alcohol abuse, uncomplicated: Secondary | ICD-10-CM | POA: Diagnosis not present

## 2018-02-13 DIAGNOSIS — R45851 Suicidal ideations: Secondary | ICD-10-CM | POA: Diagnosis not present

## 2018-02-13 DIAGNOSIS — F1020 Alcohol dependence, uncomplicated: Secondary | ICD-10-CM | POA: Diagnosis not present

## 2018-02-13 DIAGNOSIS — F1410 Cocaine abuse, uncomplicated: Secondary | ICD-10-CM | POA: Diagnosis not present

## 2018-02-13 DIAGNOSIS — Z590 Homelessness: Secondary | ICD-10-CM | POA: Diagnosis not present

## 2018-02-13 DIAGNOSIS — F1910 Other psychoactive substance abuse, uncomplicated: Secondary | ICD-10-CM | POA: Diagnosis not present

## 2018-02-14 DIAGNOSIS — F1410 Cocaine abuse, uncomplicated: Secondary | ICD-10-CM | POA: Diagnosis not present

## 2018-02-14 DIAGNOSIS — F1020 Alcohol dependence, uncomplicated: Secondary | ICD-10-CM | POA: Diagnosis not present

## 2018-02-14 DIAGNOSIS — Z590 Homelessness: Secondary | ICD-10-CM | POA: Diagnosis not present

## 2018-03-02 DIAGNOSIS — F329 Major depressive disorder, single episode, unspecified: Secondary | ICD-10-CM | POA: Diagnosis not present

## 2018-03-02 DIAGNOSIS — F321 Major depressive disorder, single episode, moderate: Secondary | ICD-10-CM | POA: Diagnosis not present

## 2018-03-02 DIAGNOSIS — I10 Essential (primary) hypertension: Secondary | ICD-10-CM | POA: Diagnosis not present

## 2018-03-02 DIAGNOSIS — R45851 Suicidal ideations: Secondary | ICD-10-CM | POA: Diagnosis not present

## 2018-03-02 DIAGNOSIS — M1990 Unspecified osteoarthritis, unspecified site: Secondary | ICD-10-CM | POA: Diagnosis not present

## 2018-03-03 ENCOUNTER — Inpatient Hospital Stay (HOSPITAL_COMMUNITY)
Admission: AD | Admit: 2018-03-03 | Discharge: 2018-03-07 | DRG: 885 | Disposition: A | Discharge status: Other Acute Inpatient Hospital | Payer: Medicare HMO | Source: Other Acute Inpatient Hospital | Attending: Psychiatry | Admitting: Psychiatry

## 2018-03-03 ENCOUNTER — Other Ambulatory Visit: Payer: Self-pay

## 2018-03-03 ENCOUNTER — Encounter (HOSPITAL_COMMUNITY): Payer: Self-pay | Admitting: *Deleted

## 2018-03-03 DIAGNOSIS — F1129 Opioid dependence with unspecified opioid-induced disorder: Secondary | ICD-10-CM | POA: Diagnosis present

## 2018-03-03 DIAGNOSIS — F1123 Opioid dependence with withdrawal: Secondary | ICD-10-CM | POA: Diagnosis not present

## 2018-03-03 DIAGNOSIS — I10 Essential (primary) hypertension: Secondary | ICD-10-CM | POA: Diagnosis present

## 2018-03-03 DIAGNOSIS — R45851 Suicidal ideations: Secondary | ICD-10-CM | POA: Diagnosis present

## 2018-03-03 DIAGNOSIS — F321 Major depressive disorder, single episode, moderate: Secondary | ICD-10-CM | POA: Diagnosis not present

## 2018-03-03 DIAGNOSIS — F1510 Other stimulant abuse, uncomplicated: Secondary | ICD-10-CM

## 2018-03-03 DIAGNOSIS — F17210 Nicotine dependence, cigarettes, uncomplicated: Secondary | ICD-10-CM | POA: Diagnosis not present

## 2018-03-03 DIAGNOSIS — F332 Major depressive disorder, recurrent severe without psychotic features: Secondary | ICD-10-CM | POA: Diagnosis not present

## 2018-03-03 DIAGNOSIS — F1420 Cocaine dependence, uncomplicated: Secondary | ICD-10-CM | POA: Diagnosis not present

## 2018-03-03 DIAGNOSIS — F1520 Other stimulant dependence, uncomplicated: Secondary | ICD-10-CM | POA: Diagnosis present

## 2018-03-03 DIAGNOSIS — G4700 Insomnia, unspecified: Secondary | ICD-10-CM | POA: Diagnosis present

## 2018-03-03 DIAGNOSIS — F419 Anxiety disorder, unspecified: Secondary | ICD-10-CM | POA: Diagnosis present

## 2018-03-03 DIAGNOSIS — F1994 Other psychoactive substance use, unspecified with psychoactive substance-induced mood disorder: Secondary | ICD-10-CM | POA: Diagnosis not present

## 2018-03-03 DIAGNOSIS — M1990 Unspecified osteoarthritis, unspecified site: Secondary | ICD-10-CM | POA: Diagnosis not present

## 2018-03-03 DIAGNOSIS — Z79899 Other long term (current) drug therapy: Secondary | ICD-10-CM

## 2018-03-03 MED ORDER — LOPERAMIDE HCL 2 MG PO CAPS
2.0000 mg | ORAL_CAPSULE | ORAL | Status: DC | PRN
  Administered 2018-03-04: 2 mg via ORAL
  Filled 2018-03-03: qty 1

## 2018-03-03 MED ORDER — CLONIDINE HCL 0.1 MG PO TABS
0.1000 mg | ORAL_TABLET | ORAL | Status: DC
  Administered 2018-03-05 – 2018-03-06 (×2): 0.1 mg via ORAL
  Filled 2018-03-03 (×4): qty 1

## 2018-03-03 MED ORDER — CLONIDINE HCL 0.1 MG PO TABS
0.1000 mg | ORAL_TABLET | Freq: Every day | ORAL | Status: DC
  Filled 2018-03-03: qty 1

## 2018-03-03 MED ORDER — NAPROXEN 500 MG PO TABS
500.0000 mg | ORAL_TABLET | Freq: Two times a day (BID) | ORAL | Status: DC | PRN
  Administered 2018-03-03 – 2018-03-06 (×5): 500 mg via ORAL
  Filled 2018-03-03 (×6): qty 1

## 2018-03-03 MED ORDER — ACETAMINOPHEN 325 MG PO TABS
650.0000 mg | ORAL_TABLET | Freq: Four times a day (QID) | ORAL | Status: DC | PRN
  Administered 2018-03-05: 650 mg via ORAL
  Filled 2018-03-03: qty 2

## 2018-03-03 MED ORDER — METHOCARBAMOL 500 MG PO TABS
500.0000 mg | ORAL_TABLET | Freq: Three times a day (TID) | ORAL | Status: DC | PRN
  Administered 2018-03-03 – 2018-03-07 (×6): 500 mg via ORAL
  Filled 2018-03-03 (×6): qty 1

## 2018-03-03 MED ORDER — ALUM & MAG HYDROXIDE-SIMETH 200-200-20 MG/5ML PO SUSP: 30.0000 mL | ORAL | Status: DC | PRN

## 2018-03-03 MED ORDER — CLONIDINE HCL 0.1 MG PO TABS
0.1000 mg | ORAL_TABLET | Freq: Four times a day (QID) | ORAL | Status: AC
  Administered 2018-03-03 – 2018-03-05 (×8): 0.1 mg via ORAL
  Filled 2018-03-03 (×9): qty 1

## 2018-03-03 MED ORDER — HYDROXYZINE HCL 25 MG PO TABS
25.0000 mg | ORAL_TABLET | Freq: Four times a day (QID) | ORAL | Status: DC | PRN
  Administered 2018-03-04 – 2018-03-06 (×8): 25 mg via ORAL
  Filled 2018-03-03: qty 30
  Filled 2018-03-03 (×8): qty 1

## 2018-03-03 MED ORDER — TRAZODONE HCL 50 MG PO TABS
50.0000 mg | ORAL_TABLET | Freq: Every evening | ORAL | Status: DC | PRN
  Administered 2018-03-03 – 2018-03-06 (×3): 50 mg via ORAL
  Filled 2018-03-03: qty 1
  Filled 2018-03-03: qty 21
  Filled 2018-03-03 (×2): qty 1

## 2018-03-03 MED ORDER — DICYCLOMINE HCL 20 MG PO TABS
20.0000 mg | ORAL_TABLET | Freq: Four times a day (QID) | ORAL | Status: DC | PRN
  Administered 2018-03-03 – 2018-03-06 (×5): 20 mg via ORAL
  Filled 2018-03-03 (×5): qty 1

## 2018-03-03 MED ORDER — MAGNESIUM HYDROXIDE 400 MG/5ML PO SUSP: 30.0000 mL | Freq: Every day | ORAL | Status: DC | PRN

## 2018-03-03 MED ORDER — NICOTINE 21 MG/24HR TD PT24
21.0000 mg | MEDICATED_PATCH | Freq: Every day | TRANSDERMAL | Status: DC
  Administered 2018-03-04 – 2018-03-07 (×4): 21 mg via TRANSDERMAL
  Filled 2018-03-03 (×6): qty 1

## 2018-03-03 MED ORDER — ONDANSETRON 4 MG PO TBDP: 4.0000 mg | ORAL_TABLET | Freq: Four times a day (QID) | ORAL | Status: DC | PRN

## 2018-03-03 NOTE — BH Assessment (Deleted)
Tele Assessment Note   Patient Name: Dominic Spencer MRN: [id number] Referring Physician: Dr. Littie Deeds Location of Patient: BH-300B IP ADULT Location of Provider: Behavioral Health TTS Department  SKIP MAREZ is an 47 y.o. male.   PER EDP REPORT: PT PRESENTS TODAY STATING DEPRESSION WITH SI PLAN TO OVERDOSE ON HEROIN.   PT STATES THAT HE HAS BEEN HOMELESS FOR THE PAST 8 MONTHS AND HAS BEEN "COUCH SURFING".   STATES HIS ONLY FAMILY IS PARENTS WHO LIVE IN [place].   HE HAS NO OTHER FRIENDS/FAMILY.   STATES FOR THE PAST 2 WEEKS HE HAS RELAPSED ON HEROIN AND METH.  IVDA.   DENIES AVH, HI.  IS SUPPOSE TO BE TAKING NEUROTIN, PROZAC, AND RISPERDAL.     PER TTS REPORT: Pt is 47 y/o disabled homeless male who voluntarily came to [facility] with suicidal thoughts and intention of overdosing on Heroin.  Pt states "I been sober for months and 2 weeks ago I bought a bag heroin with intentions of killing myself because I heard that was the quickest way to go. Today I came to the hospital because I looked at the bag of heroin and was going to shoot it all up because I just wanted to die and I knew I had to get some help." Pt reports recently "ending a 2 yr relationship 2-3 months ago" which correlates with patient's "increase depression symptoms of crying, isolating, feeling worthless and loss self-esteem".  Pt has a history of depression which has lead to "6 inpatient hospitalization due to suicide attempts".  Pt reports one attempt "I slit my wrist."  Pt admits to SA.  Pt reports Cocaine being his drug of choice and states he "started using Meth 8 moths ago as a substitute".  Pt report 1-1/2 yrs was his longest sobriety. Pt report "started back using heroin 2 weeks ago with the intentions of suicide".  Pt denies HI/A/V-hallucinations.  Pt will not contract for safety.  Pt reports being married for 8 years and divorced 10 yrs. Pt report relocating to [place] with a male but not having any relatives in the  area. Pt reports having several siblings in [place] and [place].  Pt reports becoming homeless after using all of his resources on drugs.  Pt reports he is willing to sign in voluntarily if inpatient treatment is recommended.  LPC discussed case with BH provider, Donell Sievert, PA-C who recommends inpatient treatment.  [facility] informed ER provider, Dr. Littie Deeds and ER nurse, Leavy Cella, RN of the recommended disposition.  Inpatient placement will be found.  Diagnosis: Major Depressive Disorder, Severe  Past Medical History:  Past Medical History:  Diagnosis Date  . Suicidal ideation     No past surgical history on file.  Family History: No family history on file.  Social History:  reports that he has been smoking cigarettes. He has been smoking about 1.50 packs per day. He has never used smokeless tobacco. He reports that he has current or past drug history. Drugs: Marijuana and Methamphetamines. He reports that he does not drink alcohol.  Additional Social History:     CIWA:   COWS:    Allergies: No Known Allergies  Home Medications:  No medications prior to admission.    OB/GYN Status:  No LMP for male patient.  Disposition:     This service was provided via telemedicine using a 2-way, interactive audio and video technology.  Names of all persons participating in this telemedicine service and their role in this encounter. Name: Dominic Spencer Role: patient  Name: Lorenz Coaster, [facility] Role: TTS Clinician  Name:  Role:   Name:  Role:     Burnetta Sabin 03/03/2018 6:45 PM

## 2018-03-03 NOTE — Tx Team (Signed)
Initial Treatment Plan 03/03/2018 9:53 PM [name] FKC:[id number]    PATIENT STRESSORS: Medication change or noncompliance Substance abuse   PATIENT STRENGTHS: Ability for insight Average or above average intelligence Capable of independent living General fund of knowledge   PATIENT IDENTIFIED PROBLEMS: Depression Suicidal thoughts Substance abuse Withdrawals "I want to get stabilized and back on medications"                     DISCHARGE CRITERIA:  Ability to meet basic life and health needs Improved stabilization in mood, thinking, and/or behavior Reduction of life-threatening or endangering symptoms to within safe limits Verbal commitment to aftercare and medication compliance Withdrawal symptoms are absent or subacute and managed without 24-hour nursing intervention  PRELIMINARY DISCHARGE PLAN: Attend aftercare/continuing care group  PATIENT/FAMILY INVOLVEMENT: This treatment plan has been presented to and reviewed with the patient, Dominic Spencer, and/or family member, .  The patient and family have been given the opportunity to ask questions and make suggestions.  Dhrithi Riche, [place], [place] 03/03/2018, 9:53 PM

## 2018-03-03 NOTE — Progress Notes (Signed)
Dominic Spencer is a 47 year old male pt admitted on voluntary basis from [facility]. On admission he does endorse passive SI but is able to contract for safety while in the hospital. He reports that he has been abusing meth and heroin on a daily basis with the last use being 2 days ago. He reports that he has not been taking his medications like he should. He reports that he goes to [facility] every 2 weeks to get his suboxone prescription refilled and reports that he has been abusing heroin while also using the suboxone. He reports that he is currently homeless and reports that he is unsure of where he will go once he is discharged. He spoke about wanting to get back on some medications while he is here. Braylenn was escorted to the unit, oriented to the milieu and safety maintained.

## 2018-03-03 NOTE — BH Assessment (Signed)
Tele Assessment Note   Patient Name: Dominic Spencer MRN: [id number] Referring Physician: Dr. Littie Deeds Location of Patient: BH-300B IP ADULT Location of Provider: Behavioral Health TTS Department  HASSEL UPHOFF is an 47 y.o. male.   PER EDP REPORT: PT PRESENTS TODAY STATING DEPRESSION WITH SI PLAN TO OVERDOSE ON HEROIN.   PT STATES THAT HE HAS BEEN HOMELESS FOR THE PAST 8 MONTHS AND HAS BEEN "COUCH SURFING".   STATES HIS ONLY FAMILY IS PARENTS WHO LIVE IN [place].   HE HAS NO OTHER FRIENDS/FAMILY.   STATES FOR THE PAST 2 WEEKS HE HAS RELAPSED ON HEROIN AND METH.  IVDA.   DENIES AVH, HI.  IS SUPPOSE TO BE TAKING NEUROTIN, PROZAC, AND RISPERDAL.     PER TTS REPORT: Pt is 47 y/o disabled homeless male who voluntarily came to [facility] with suicidal thoughts and intention of overdosing on Heroin.  Pt states "I been sober for months and 2 weeks ago I bought a bag heroin with intentions of killing myself because I heard that was the quickest way to go. Today I came to the hospital because I looked at the bag of heroin and was going to shoot it all up because I just wanted to die and I knew I had to get some help." Pt reports recently "ending a 2 yr relationship 2-3 months ago" which correlates with patient's "increase depression symptoms of crying, isolating, feeling worthless and loss self-esteem".  Pt has a history of depression which has lead to "6 inpatient hospitalization due to suicide attempts".  Pt reports one attempt "I slit my wrist."  Pt admits to SA.  Pt reports Cocaine being his drug of choice and states he "started using Meth 8 moths ago as a substitute".  Pt report 1-1/2 yrs was his longest sobriety. Pt report "started back using heroin 2 weeks ago with the intentions of suicide".  Pt denies HI/A/V-hallucinations.  Pt will not contract for safety.  Pt reports being married for 8 years and divorced 10 yrs. Pt report relocating to  with a male but not having any relatives in the  area. Pt reports having several siblings in [place] and [place].  Pt reports becoming homeless after using all of his resources on drugs.  Pt reports he is willing to sign in voluntarily if inpatient treatment is recommended.  Disposition: LPC discussed case with BH provider, Donell Sievert, PA-C who recommends inpatient treatment.  [facility] informed ER provider, Dr. Littie Deeds and ER nurse, Leavy Cella, RN of the recommended disposition.  Inpatient placement will be found.  Diagnosis: Major Depressive Disorder, Severe  Past Medical History:  Past Medical History:  Diagnosis Date  . Suicidal ideation     No past surgical history on file.  Family History: No family history on file.  Social History:  reports that he has been smoking cigarettes. He has been smoking about 1.50 packs per day. He has never used smokeless tobacco. He reports that he has current or past drug history. Drugs: Marijuana and Methamphetamines. He reports that he does not drink alcohol.  Additional Social History:  Alcohol / Drug Use Pain Medications: see MAR Prescriptions: see MAR Over the Counter: see MAR  CIWA:   COWS:    Allergies: No Known Allergies  Home Medications:  No medications prior to admission.    OB/GYN Status:  No LMP for male patient.  General Assessment Data Is this a Tele or Face-to-Face Assessment?: Tele Assessment Is this an Initial Assessment or a Re-assessment  for this encounter?: Initial Assessment Language Other than English: Yes What is your preferred language: Other (Comment: Enter the language) What gender do you identify as?: (male) Marital status: (single) Pregnancy Status: (n/a) Living Arrangements: (homeless) Can pt return to current living arrangement?: (homeless living in hotels) Admission Status: (voluntary) Is patient capable of signing voluntary admission?: (yes) Referral Source: (self)  Medical Screening Exam [facility] Walk-in ONLY) Medical Exam completed: (yes)  Crisis Care  Plan Living Arrangements: (homeless) Legal Guardian: (self) Name of Psychiatrist: (unknown) Name of Therapist: (unknown)  Education Status Is patient currently in school?: (n/a) Highest grade of school patient has completed: (n/a)  Risk to self with the past 6 months Suicidal Ideation: (with plan to overdose) Has patient been a risk to self within the past 6 months prior to admission? : (unknown) Suicidal Intent: (plan to overdose on heroin) Has patient had any suicidal intent within the past 6 months prior to admission? : (unknown) Is patient at risk for suicide?: (yes) Suicidal Plan?: (overdose on heroin) Has patient had any suicidal plan within the past 6 months prior to admission? : (unknown) Access to Means: (yes) Specify Access to Suicidal Means: (heroin user) What has been your use of drugs/alcohol within the last 12 months?: (heroin and meth and cocaine) Previous Attempts/Gestures: (total of 6 attempts, 10 years ago slit wrist after divorce) How many times?: 1(6) Other Self Harm Risks: (unknown) Triggers for Past Attempts: (n/a) Intentional Self Injurious Behavior: (unknown) Family Suicide History: (unknown) Recent stressful life event(s): (relationships, homeless and financial) Persecutory voices/beliefs?: (denies) Depression: (yes) Depression Symptoms: (hopelessness, poor concentration, sadness, sleep changes....) Substance abuse history and/or treatment for substance abuse?: (heroin and meth) Suicide prevention information given to non-admitted patients: (patient accepted for admission)  Risk to Others within the past 6 months Homicidal Ideation: (denies) Does patient have any lifetime risk of violence toward others beyond the six months prior to admission? : (denies) Thoughts of Harm to Others: (denies) Current Homicidal Intent: (denies) Current Homicidal Plan: (denies) Access to Homicidal Means: (denies) Identified Victim: (n/a) History of harm to others?:  (denies) Assessment of Violence: (denies) Violent Behavior Description: (none) Does patient have access to weapons?: (patient denies) Criminal Charges Pending?: (patient denies) Does patient have a court date: (denies) Is patient on probation?: (denies)  Psychosis Hallucinations: (denies) Delusions: (denies)  Mental Status Report Appearance/Hygiene: (Casual and stated age) Mood: (sad and anxious) Affect: (congruent with mood, depressed and anxious) Anxiety Level: (excessive worries) Thought Processes: (coherent) Judgement: (impaired) Orientation: (Alert and oriented x4) Obsessive Compulsive Thoughts/Behaviors: (unknown)  Cognitive Functioning Concentration: (normal) Memory: (intact) Is patient IDD: (no) Insight: (normal) Impulse Control: (impaired) Appetite: (unknown) Have you had any weight changes? : (unknown) Sleep: (poor) Total Hours of Sleep: (2 hours) Vegetative Symptoms: (unknown)  ADLScreening [facility] Assessment Services) Patient's cognitive ability adequate to safely complete daily activities?: (yes) Patient able to express need for assistance with ADLs?: (yes) Independently performs ADLs?: (yes)  Prior Inpatient Therapy Prior Inpatient Therapy: (6 inpatient due to suicide attempts)  Prior Outpatient Therapy Prior Outpatient Therapy: (unknown) Does patient have an ACCT team?: (unknown) Does patient have Intensive In-House Services?  : (unknown) Does patient have [facility] services? : (unknown) Does patient have P4CC services?: (unknown)  ADL Screening (condition at time of admission) Patient's cognitive ability adequate to safely complete daily activities?: (yes) Patient able to express need for assistance with ADLs?: (yes) Independently performs ADLs?: (yes)  Disposition:  Disposition Initial Assessment Completed for this Encounter: (yes) Disposition of Patient: (inpatient) Type of inpatient treatment program: (mental  health inpatient)   LPC discussed case with BH provider, Donell Sievert, PA-C who recommends inpatient treatment.  [facility] informed ER provider, Dr. Littie Deeds and ER nurse, Leavy Cella, RN of the recommended disposition.  Inpatient placement will be found.   This service was provided via telemedicine using a 2-way, interactive audio and video technology.  Names of all persons participating in this telemedicine service and their role in this encounter. Name: Dominic Spencer Role: patient  Name: Lorenz Coaster, [facility] Role: TTS Clinician  Name:  Role:   Name:  Role:     Burnetta Sabin 03/03/2018 7:44 PM

## 2018-03-04 ENCOUNTER — Encounter (HOSPITAL_COMMUNITY): Payer: Self-pay | Admitting: Behavioral Health

## 2018-03-04 DIAGNOSIS — F1510 Other stimulant abuse, uncomplicated: Secondary | ICD-10-CM

## 2018-03-04 DIAGNOSIS — R45851 Suicidal ideations: Secondary | ICD-10-CM

## 2018-03-04 DIAGNOSIS — F419 Anxiety disorder, unspecified: Secondary | ICD-10-CM

## 2018-03-04 DIAGNOSIS — F1994 Other psychoactive substance use, unspecified with psychoactive substance-induced mood disorder: Secondary | ICD-10-CM

## 2018-03-04 DIAGNOSIS — F17210 Nicotine dependence, cigarettes, uncomplicated: Secondary | ICD-10-CM

## 2018-03-04 LAB — RAPID URINE DRUG SCREEN, HOSP PERFORMED
Amphetamines: NOT DETECTED
Barbiturates: NOT DETECTED
Benzodiazepines: POSITIVE — AB
COCAINE: NOT DETECTED
Opiates: POSITIVE — AB
Tetrahydrocannabinol: NOT DETECTED

## 2018-03-04 MED ORDER — GABAPENTIN 100 MG PO CAPS
100.0000 mg | ORAL_CAPSULE | Freq: Three times a day (TID) | ORAL | Status: DC
  Administered 2018-03-04 – 2018-03-06 (×6): 100 mg via ORAL
  Filled 2018-03-04 (×10): qty 1

## 2018-03-04 MED ORDER — RISPERIDONE 1 MG PO TABS
1.0000 mg | ORAL_TABLET | Freq: Every day | ORAL | Status: DC
  Administered 2018-03-04 – 2018-03-06 (×3): 1 mg via ORAL
  Filled 2018-03-04 (×4): qty 1
  Filled 2018-03-04: qty 21

## 2018-03-04 MED ORDER — SERTRALINE HCL 50 MG PO TABS
50.0000 mg | ORAL_TABLET | Freq: Every day | ORAL | Status: DC
  Administered 2018-03-04 – 2018-03-07 (×4): 50 mg via ORAL
  Filled 2018-03-04: qty 21
  Filled 2018-03-04 (×6): qty 1
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
## 2018-03-04 NOTE — BHH Suicide Risk Assessment (Signed)
BHH INPATIENT:  Family/Significant Other Suicide Prevention Education  Suicide Prevention Education:  Patient Refusal for Family/Significant Other Suicide Prevention Education: The patient Dominic Spencer has refused to provide written consent for family/significant other to be provided Family/Significant Other Suicide Prevention Education during admission and/or prior to discharge.  Physician notified.   SPE completed with patient, as patient refused to consent to family contact. SPI pamphlet provided to pt and pt was encouraged to share information with support network, ask questions, and talk about any concerns relating to SPE. Patient denies access to guns/firearms and verbalized understanding of information provided. Mobile Crisis information also provided to patient.   Maeola Sarah 03/04/2018, 10:53 AM
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
## 2018-03-04 NOTE — [facility] Group Notes (Signed)
[facility] Mental Health Association Group Therapy 03/04/2018 1:15pm  Type of Therapy: Mental Health Association Presentation  Participation Level: Active  Participation Quality: Attentive  Affect: Appropriate  Cognitive: Oriented  Insight: Developing/Improving  Engagement in Therapy: Engaged  Modes of Intervention: Discussion, Education and Socialization  Summary of Progress/Problems: Mental Health Association (MHA) Speaker came to talk about his personal journey with mental health. The pt processed ways by which to relate to the speaker. MHA speaker provided handouts and educational information pertaining to groups and services offered by the [facility]. Pt was engaged in speaker's presentation and was receptive to resources provided.    Rona Ravens, LCSW 03/04/2018 3:11 PM

## 2018-03-04 NOTE — H&P (Addendum)
Psychiatric Admission Assessment Adult  Patient Identification: Dominic Spencer MRN:  [id number] Date of Evaluation:  03/04/2018 Chief Complaint:  MDD POLYSUBSTANCE USE DISORDER Principal Diagnosis: <principal problem not specified> Diagnosis:   Patient Active Problem List   Diagnosis Date Noted  . Severe recurrent major depression without psychotic features ([place]) [F33.2] 03/03/2018  . Substance induced mood disorder ([place]) [F19.94]   . Cocaine use disorder, severe, dependence ([place]) [F14.20]   . Methamphetamine use disorder, severe, dependence ([place]) [F15.20]   . Opioid dependence with opioid-induced disorder ([place]) [F11.29]   . Polysubstance dependence ([place]) [F19.20]   . MDD (major depressive disorder), severe ([place]) [F32.2] 01/03/2018  . Acute pain of left shoulder [M25.512]   . MDD (major depressive disorder), recurrent severe, without psychosis ([place]) [F33.2] 03/10/2017   History of Present Illness:47 year old male, no children, unemployed (on disability) , currently homeless. Patient is well known to [facility] as he was recently discharged July, 2019. He presents with a psychiatric history of of depression as well as polysubstance abuse that includes methamphetamines and opiates. He states, following his most recent discharge, he was doing well for a period of a couple of weeks then stated he started using again and, "everything went down hill." Reports he relapsed about two weeks ago and reports daily methamphetamine use since then. He states he has too used using heroin daily over the last couple of weeks. Reports prior to admission and after relapsing, he started to have suicidal thoughts with a plan to overdose on heroin. He states, his depression worsened at that time. During his last discharge he was on Zoloft and Risperidone which he reports were helpful and well tolerated. He reports he stopped the medications a few days ago. Reports he is interested in restarting his medications and interested  in long term substance abuse treatment following discharge. He denies any medical history. No family history of mental health issues noted. He reports he has been on suboxone for substance abuse treatment although he was advised that this medication would not be resumed during this hospital course. He denies HI or AVH and does not appear internally preoccupied. Endorse both depression and anxiety.    Associated Signs/Symptoms: Depression Symptoms:  depressed mood, suicidal thoughts with specific plan, anxiety, (Hypo) Manic Symptoms:  none Anxiety Symptoms:  Excessive Worry, Psychotic Symptoms:  none PTSD Symptoms: NA Total Time spent with patient: 45 minutes  Past Psychiatric History: Previous inpatient admission, reports a diagnosis of major depressive disorder and insomnia.  Reports taking Zoloft, Prozac and Seroquel in the past however denies that he has been medication adherent.  Is the patient at risk to self? Yes.    Has the patient been a risk to self in the past 6 months? Yes.    Has the patient been a risk to self within the distant past? Yes.    Is the patient a risk to others? No.  Has the patient been a risk to others in the past 6 months? No.  Has the patient been a risk to others within the distant past? No.   Prior Inpatient Therapy: Prior Inpatient Therapy: (6 inpatient due to suicide attempts) Prior Outpatient Therapy: Prior Outpatient Therapy: (unknown) Does patient have an ACCT team?: (unknown) Does patient have Intensive In-House Services?  : (unknown) Does patient have [facility] services? : (unknown) Does patient have P4CC services?: (unknown)  Alcohol Screening: 1. How often do you have a drink containing alcohol?: Monthly or less 2. How many drinks containing alcohol do  you have on a typical day when you are drinking?: 1 or 2 3. How often do you have six or more drinks on one occasion?: Never AUDIT-C Score: 1 4. How often during the last year have you found  that you were not able to stop drinking once you had started?: Never 5. How often during the last year have you failed to do what was normally expected from you becasue of drinking?: Never 6. How often during the last year have you needed a first drink in the morning to get yourself going after a heavy drinking session?: Never 7. How often during the last year have you had a feeling of guilt of remorse after drinking?: Never 8. How often during the last year have you been unable to remember what happened the night before because you had been drinking?: Never 9. Have you or someone else been injured as a result of your drinking?: No 10. Has a relative or friend or a doctor or another health worker been concerned about your drinking or suggested you cut down?: No Alcohol Use Disorder Identification Test Final Score (AUDIT): 1 Intervention/Follow-up: AUDIT Score <7 follow-up not indicated Substance Abuse History in the last 12 months:  Yes.   Consequences of Substance Abuse: Withdrawal Symptoms:   skin crawaling  Previous Psychotropic Medications: Yes  Psychological Evaluations: No  Past Medical History:  Past Medical History:  Diagnosis Date  . Suicidal ideation    History reviewed. No pertinent surgical history. Family History: History reviewed. No pertinent family history. Family Psychiatric  History: None noted  Tobacco Screening: Have you used any form of tobacco in the last 30 days? (Cigarettes, Smokeless Tobacco, Cigars, and/or Pipes): Yes Tobacco use, Select all that apply: 5 or more cigarettes per day Are you interested in Tobacco Cessation Medications?: Yes, will notify MD for an order Counseled patient on smoking cessation including recognizing danger situations, developing coping skills and basic information about quitting provided: Refused/Declined practical counseling Social History:  Social History   Substance and Sexual Activity  Alcohol Use No  . Frequency: Never      Social History   Substance and Sexual Activity  Drug Use Yes  . Types: Marijuana, Methamphetamines, Heroin    Additional Social History: Marital status: (single)    Pain Medications: see MAR Prescriptions: see MAR Over the Counter: see MAR                    Allergies:  No Known Allergies Lab Results: No results found for this or any previous visit (from the past 48 hour(s)).  Blood Alcohol level:  Lab Results  Component Value Date   ETH <10 94/76/5465    Metabolic Disorder Labs:  No results found for: HGBA1C, MPG No results found for: PROLACTIN No results found for: CHOL, TRIG, HDL, CHOLHDL, VLDL, LDLCALC  Current Medications: Current Facility-Administered Medications  Medication Dose Route Frequency Provider Last Rate Last Dose  . acetaminophen (TYLENOL) tablet 650 mg  650 mg Oral Q6H PRN Lindon Romp A, NP      . alum & mag hydroxide-simeth (MAALOX/MYLANTA) 200-200-20 MG/5ML suspension 30 mL  30 mL Oral Q4H PRN Lindon Romp A, NP      . cloNIDine (CATAPRES) tablet 0.1 mg  0.1 mg Oral QID Lindon Romp A, NP   0.1 mg at 03/04/18 1058   Followed by  . [START ON 03/05/2018] cloNIDine (CATAPRES) tablet 0.1 mg  0.1 mg Oral BH-qamhs Rozetta Nunnery, NP  Followed by  . [START ON 03/08/2018] cloNIDine (CATAPRES) tablet 0.1 mg  0.1 mg Oral QAC breakfast Lindon Romp A, NP      . dicyclomine (BENTYL) tablet 20 mg  20 mg Oral Q6H PRN Lindon Romp A, NP   20 mg at 03/04/18 0953  . gabapentin (NEURONTIN) capsule 100 mg  100 mg Oral TID Mitsuko Luera, Myer Peer, MD   100 mg at 03/04/18 1153  . hydrOXYzine (ATARAX/VISTARIL) tablet 25 mg  25 mg Oral Q6H PRN Lindon Romp A, NP   25 mg at 03/04/18 0953  . loperamide (IMODIUM) capsule 2-4 mg  2-4 mg Oral PRN Lindon Romp A, NP   2 mg at 03/04/18 0953  . magnesium hydroxide (MILK OF MAGNESIA) suspension 30 mL  30 mL Oral Daily PRN Lindon Romp A, NP      . methocarbamol (ROBAXIN) tablet 500 mg  500 mg Oral Q8H PRN Lindon Romp A, NP   500  mg at 03/03/18 2203  . naproxen (NAPROSYN) tablet 500 mg  500 mg Oral BID PRN Lindon Romp A, NP   500 mg at 03/03/18 2202  . nicotine (NICODERM CQ - dosed in mg/24 hours) patch 21 mg  21 mg Transdermal Daily Sharma Covert, MD   21 mg at 03/04/18 3016  . ondansetron (ZOFRAN-ODT) disintegrating tablet 4 mg  4 mg Oral Q6H PRN Lindon Romp A, NP      . risperiDONE (RISPERDAL) tablet 1 mg  1 mg Oral QHS Macel Yearsley A, MD      . sertraline (ZOLOFT) tablet 50 mg  50 mg Oral Daily Adriyanna Christians, Myer Peer, MD   50 mg at 03/04/18 1153  . traZODone (DESYREL) tablet 50 mg  50 mg Oral QHS PRN Rozetta Nunnery, NP   50 mg at 03/03/18 2203   PTA Medications: Medications Prior to Admission  Medication Sig Dispense Refill Last Dose  . buPROPion (WELLBUTRIN XL) 150 MG 24 hr tablet Take 150 mg by mouth daily.  5 03/02/2018  . cloNIDine (CATAPRES) 0.2 MG tablet Take 0.2 mg by mouth 4 (four) times daily as needed (For addiction or blood pressure.).    03/02/2018  . gabapentin (NEURONTIN) 300 MG capsule Take 3 capsules (900 mg total) by mouth 3 (three) times daily. 240 capsule 0 03/02/2018  . hydrOXYzine (ATARAX/VISTARIL) 50 MG tablet Take 1 tablet (50 mg total) by mouth every 6 (six) hours as needed for anxiety. 30 tablet 0 03/02/2018  . lisinopril (PRINIVIL,ZESTRIL) 10 MG tablet Take 1 tablet (10 mg total) by mouth daily. 30 tablet 0 03/02/2018  . QUEtiapine (SEROQUEL) 100 MG tablet Take 100 mg by mouth at bedtime.  1 03/02/2018  . QUEtiapine (SEROQUEL) 50 MG tablet Take 50 mg by mouth daily.  1 03/03/2018  . risperiDONE (RISPERDAL) 0.5 MG tablet Take 1 tablet (0.5 mg total) by mouth daily before breakfast. 30 tablet 0 03/02/2018  . risperiDONE (RISPERDAL) 2 MG tablet Take 1 tablet (2 mg total) by mouth at bedtime. 30 tablet 0 03/02/2018  . ZUBSOLV 5.7-1.4 MG SUBL Place 1 tablet under the tongue 2 (two) times daily.   0 03/02/2018  . nicotine (NICODERM CQ - DOSED IN MG/24 HOURS) 21 mg/24hr patch Place 1 patch (21 mg total) onto the  skin daily. (Patient not taking: Reported on 01/17/2018) 28 patch 0 Not Taking at Unknown time  . propranolol (INDERAL) 20 MG tablet Take 20 mg by mouth 2 (two) times daily.   Read note at Unknown time  .  sertraline (ZOLOFT) 100 MG tablet Take 1 tablet (100 mg total) by mouth daily. (Patient not taking: Reported on 01/17/2018) 30 tablet 0 Not Taking at Unknown time  . traZODone (DESYREL) 50 MG tablet Take 1 tablet (50 mg total) by mouth at bedtime as needed for sleep. (Patient not taking: Reported on 01/17/2018) 30 tablet 0 Not Taking at Unknown time    Musculoskeletal: Strength & Muscle Tone: within normal limits Gait & Station: normal Patient leans: N/A  Psychiatric Specialty Exam: Physical Exam  Nursing note and vitals reviewed. Constitutional: He is oriented to person, place, and time.  Neurological: He is alert and oriented to person, place, and time.    Review of Systems  Psychiatric/Behavioral: Positive for depression, substance abuse and suicidal ideas. Negative for hallucinations and memory loss. The patient is nervous/anxious. The patient does not have insomnia.   All other systems reviewed and are negative.   Blood pressure (!) 140/97, pulse 98, temperature 99 F (37.2 C), temperature source Oral, resp. rate 18, height 6' 2" (1.88 m), weight 86.2 kg.Body mass index is 24.39 kg/m.  General Appearance: Fairly Groomed  Eye Contact:  Good  Speech:  Clear and Coherent and Normal Rate  Volume:  Normal  Mood:  Anxious and Depressed  Affect:  Congruent  Thought Process:  Coherent, Goal Directed, Linear and Descriptions of Associations: Intact  Orientation:  Full (Time, Place, and Person)  Thought Content:  Logical  Suicidal Thoughts:  Yes.  with intent/plan  Homicidal Thoughts:  No  Memory:  Immediate;   Fair Recent;   Fair  Judgement:  Fair  Insight:  Fair  Psychomotor Activity:  Normal  Concentration:  Concentration: Fair and Attention Span: Fair  Recall:  [employer] of  Knowledge:  Fair  Language:  Good  Akathisia:  Negative  Handed:  Right  AIMS (if indicated):     Assets:  Communication Skills Desire for Improvement Resilience Social Support  ADL's:  Intact  Cognition:  WNL  Sleep:  Number of Hours: 6.25    Treatment Plan Summary: Daily contact with patient to assess and evaluate symptoms and progress in treatment  Treatment Plan/Recommendations: 1. Admit for crisis management and stabilization, estimated length of stay 3-5 days.  2. Medication management to reduce current symptoms to base line and improve the patient's overall level of functioning: Restart Zoloft 50 mgrs QDAY , Risperidone 1 mgr daily at bedtime. Trazodone 50 mg po daily at bedtime as needed for sleep, Vistaril 25 mg po q6hrs  as needed and Neurontin 100 mg po TID for anxiety,? 3. Treat health problems as indicated.  4. Develop treatment plan to decrease risk of relapse upon discharge and the need for readmission.  5. Psycho-social education regarding relapse prevention and self care.  6. Health care follow up as needed for medical problems.  7. Review, reconcile, and reinstate any pertinent home medications for other health issues where appropriate. 8. Call for consults with hospitalist for any additional specialty patient care services as needed. 9. Begin Clonidine detox protocol for withdrawal symptoms. 10. Labs: Patient interested in Hep B/C/HIV testing, due to history of IVDA/ Labs ordered. Ordered lipid panel, CBC, CMP and HgbA1c as well as UDS. EKG reviewed which is normal.     Physician Treatment Plan for Primary Diagnosis: <principal problem not specified> Long Term Goal(s): Improvement in symptoms so as ready for discharge  Short Term Goals: Ability to identify changes in lifestyle to reduce recurrence of condition will improve, Ability to verbalize  feelings will improve, Ability to disclose and discuss suicidal ideas, Compliance with prescribed medications will improve  and Ability to identify triggers associated with substance abuse/mental health issues will improve  Physician Treatment Plan for Secondary Diagnosis: Active Problems:   Severe recurrent major depression without psychotic features ([place])  Long Term Goal(s): Improvement in symptoms so as ready for discharge  Short Term Goals: Ability to disclose and discuss suicidal ideas, Ability to demonstrate self-control will improve, Ability to identify and develop effective coping behaviors will improve and Ability to maintain clinical measurements within normal limits will improve  I certify that inpatient services furnished can reasonably be expected to improve the patient's condition.    Mordecai Maes, NP 9/3/20191:05 PM  I have discussed case with NP and have met with patient  Agree with NP note and assessment  47, no children, unemployed ( on disability) , currently homeless. He is known to our unit from prior psychiatric admissions, most recently in July/19. States " I was doing OK for a couple of weeks but then I started using and going down hill". Reports history of methamphetamine dependence. Reports he relapsed about two weeks ago and has been using daily. He also has been using heroin daily over the last couple of weeks. Presented to ED voluntarily reporting worsening depression and suicidal ideations, with thoughts of overdosing on heroin. Reports he stopped taking his psychiatric medications several days ago. Denies medical history .   Dx-  Methamphetamine Dependence, Opiate Dependence, Substance Induced Mood Disorder,Depressed  Plan- Inpatient admission.  We discussed medication options- states Zoloft/Risperidone have been helpful and well tolerated . Restart Zoloft 50 mgrs QDAY , Risperidone 1 mgr QHS Clonidine detox protocol to address opiate WDL symptoms. Patient interested in Hep B/C/HIV testing, due to history of IVDA.

## 2018-03-04 NOTE — Progress Notes (Signed)
[facility] Group Notes:  (Nursing/MHT/Case Management/Adjunct)  Date:  03/04/2018  Time:  4:00 PM  Type of Therapy:  Nurse Education  Participation Level:  Active  Participation Quality:  Appropriate, Sharing and Supportive  Affect:  Appropriate  Cognitive:  Alert and Oriented  Insight:  Appropriate and Improving  Engagement in Group:  Engaged and Supportive  Modes of Intervention:  Discussion and Education  Summary of Progress/Problems: Patient participated appropriately and insight is improving.   Kiasha Bellin 03/04/2018, 5:43 PM 

## 2018-03-04 NOTE — Plan of Care (Signed)
Progress Note  D: pt presented to the medication window; compliant with medication administration. Pt denies any physical pain, rating this a 0/10. Pt does complain of diarrhea, agitation, chills, cramps, and irritability. Pt rates his depression/hopelessness/anxiety all 9/10 respectively. Pt states he is still having passive SI but this is "normal" to his everyday life he says. Pt denies any hi/ah/vh and verbally agrees to approach staff before harming himself at [facility]. Pt declined to put a goal for today on his self inventory. A: pt provided support and encouragement. Medications given per protocol and standing orders. Q79m safety checks implemented and continued. R: pt safe on the unit. Will continue to monitor.   Pt progressing in the following metrics.  Problem: Education: Goal: Knowledge of [facility] Education information/materials will improve Outcome: Progressing Goal: Emotional status will improve Outcome: Progressing Goal: Mental status will improve Outcome: Progressing Goal: Verbalization of understanding the information provided will improve Outcome: Progressing   Problem: Activity: Goal: Interest or engagement in activities will improve Outcome: Progressing Goal: Sleeping patterns will improve Outcome: Progressing

## 2018-03-05 ENCOUNTER — Encounter (HOSPITAL_COMMUNITY): Payer: Self-pay | Admitting: Behavioral Health

## 2018-03-05 LAB — LIPID PANEL
Cholesterol: 144 mg/dL (ref 0–200)
HDL: 38 mg/dL — ABNORMAL LOW (ref >40)
LDL Cholesterol: 84 mg/dL (ref 0–99)
Total CHOL/HDL Ratio: 3.8 RATIO
Triglycerides: 111 mg/dL (ref <150)
VLDL: 22 mg/dL (ref 0–40)

## 2018-03-05 LAB — CBC WITH DIFFERENTIAL/PLATELET
BASOS PCT: 1 %
Basophils Absolute: 0 10*3/uL (ref 0.0–0.1)
Eosinophils Absolute: 0.1 10*3/uL (ref 0.0–0.7)
Eosinophils Relative: 1 %
HEMATOCRIT: 40.8 % (ref 39.0–52.0)
Hemoglobin: 14.2 g/dL (ref 13.0–17.0)
LYMPHS ABS: 1.7 10*3/uL (ref 0.7–4.0)
LYMPHS PCT: 35 %
MCH: 31.3 pg (ref 26.0–34.0)
MCHC: 34.8 g/dL (ref 30.0–36.0)
MCV: 90.1 fL (ref 78.0–100.0)
MONO ABS: 0.3 10*3/uL (ref 0.1–1.0)
MONOS PCT: 5 %
NEUTROS ABS: 2.8 10*3/uL (ref 1.7–7.7)
Neutrophils Relative %: 58 %
Platelets: 322 10*3/uL (ref 150–400)
RBC: 4.53 MIL/uL (ref 4.22–5.81)
RDW: 12.8 % (ref 11.5–15.5)
WBC: 4.9 10*3/uL (ref 4.0–10.5)

## 2018-03-05 LAB — COMPREHENSIVE METABOLIC PANEL
ALT: 20 U/L (ref 0–44)
ANION GAP: 11 (ref 5–15)
AST: 17 U/L (ref 15–41)
Albumin: 3.4 g/dL — ABNORMAL LOW (ref 3.5–5.0)
Alkaline Phosphatase: 75 U/L (ref 38–126)
BILIRUBIN TOTAL: 0.3 mg/dL (ref 0.3–1.2)
BUN: 12 mg/dL (ref 6–20)
CO2: 24 mmol/L (ref 22–32)
Calcium: 8.9 mg/dL (ref 8.9–10.3)
Chloride: 111 mmol/L (ref 98–111)
Creatinine, Ser: 0.78 mg/dL (ref 0.61–1.24)
GFR calc Af Amer: >60 mL/min (ref >60)
GLUCOSE: 111 mg/dL — AB (ref 70–99)
POTASSIUM: 3.9 mmol/L (ref 3.5–5.1)
Sodium: 146 mmol/L — ABNORMAL HIGH (ref 135–145)
Total Protein: 6 g/dL — ABNORMAL LOW (ref 6.5–8.1)

## 2018-03-05 LAB — HEMOGLOBIN A1C
HEMOGLOBIN A1C: 5.4 % (ref 4.8–5.6)
Mean Plasma Glucose: 108.28 mg/dL

## 2018-03-05 LAB — HIV ANTIBODY (ROUTINE TESTING W REFLEX): HIV SCREEN 4TH GENERATION: NONREACTIVE

## 2018-03-05 NOTE — [facility] Group Notes (Signed)
[facility] Group Notes:  (Nursing/MHT/Case Management/Adjunct)  Date:  03/05/2018  Time:  4:00 pm  Type of Therapy:  Psychoeducational Group  Participation Level:  Active  Participation Quality:  Appropriate  Affect:  Appropriate  Cognitive:  Appropriate  Insight:  Appropriate  Engagement in Group:  Engaged  Modes of Intervention:  Education  Summary of Progress/Problems: Patient was alert and participated appropriately in group.     Quintella Reichert [place] 03/05/2018, 6:02 PM

## 2018-03-05 NOTE — Tx Team (Signed)
Interdisciplinary Treatment and Diagnostic Plan Update  03/05/2018 Time of Session: [id number] Dominic Spencer MRN: [id number]  Principal Diagnosis: Severe recurrent major depression without psychotic features [facility])  Secondary Diagnoses: Principal Problem:   Severe recurrent major depression without psychotic features (HCC) Active Problems:   Methamphetamine abuse (HCC)   Current Medications:  Current Facility-Administered Medications  Medication Dose Route Frequency Provider Last Rate Last Dose  . acetaminophen (TYLENOL) tablet 650 mg  650 mg Oral Q6H PRN Nira Conn A, NP      . alum & mag hydroxide-simeth (MAALOX/MYLANTA) 200-200-20 MG/5ML suspension 30 mL  30 mL Oral Q4H PRN Nira Conn A, NP      . cloNIDine (CATAPRES) tablet 0.1 mg  0.1 mg Oral QID Nira Conn A, NP   0.1 mg at 03/05/18 1114   Followed by  . cloNIDine (CATAPRES) tablet 0.1 mg  0.1 mg Oral BH-qamhs Jackelyn Poling, NP       Followed by  . [START ON 03/08/2018] cloNIDine (CATAPRES) tablet 0.1 mg  0.1 mg Oral QAC breakfast Nira Conn A, NP      . dicyclomine (BENTYL) tablet 20 mg  20 mg Oral Q6H PRN Nira Conn A, NP   20 mg at 03/04/18 2128  . gabapentin (NEURONTIN) capsule 100 mg  100 mg Oral TID Cobos, Rockey Situ, MD   100 mg at 03/05/18 1114  . hydrOXYzine (ATARAX/VISTARIL) tablet 25 mg  25 mg Oral Q6H PRN Nira Conn A, NP   25 mg at 03/05/18 1114  . loperamide (IMODIUM) capsule 2-4 mg  2-4 mg Oral PRN Nira Conn A, NP   2 mg at 03/04/18 0953  . magnesium hydroxide (MILK OF MAGNESIA) suspension 30 mL  30 mL Oral Daily PRN Nira Conn A, NP      . methocarbamol (ROBAXIN) tablet 500 mg  500 mg Oral Q8H PRN Nira Conn A, NP   500 mg at 03/04/18 2129  . naproxen (NAPROSYN) tablet 500 mg  500 mg Oral BID PRN Nira Conn A, NP   500 mg at 03/05/18 1114  . nicotine (NICODERM CQ - dosed in mg/24 hours) patch 21 mg  21 mg Transdermal Daily Antonieta Pert, MD   21 mg at 03/05/18 0817  . ondansetron (ZOFRAN-ODT)  disintegrating tablet 4 mg  4 mg Oral Q6H PRN Nira Conn A, NP      . risperiDONE (RISPERDAL) tablet 1 mg  1 mg Oral QHS Cobos, Rockey Situ, MD   1 mg at 03/04/18 2126  . sertraline (ZOLOFT) tablet 50 mg  50 mg Oral Daily Cobos, Rockey Situ, MD   50 mg at 03/05/18 0816  . traZODone (DESYREL) tablet 50 mg  50 mg Oral QHS PRN Jackelyn Poling, NP   50 mg at 03/03/18 2203   PTA Medications: Medications Prior to Admission  Medication Sig Dispense Refill Last Dose  . buPROPion (WELLBUTRIN XL) 150 MG 24 hr tablet Take 150 mg by mouth daily.  5 03/02/2018  . cloNIDine (CATAPRES) 0.2 MG tablet Take 0.2 mg by mouth 4 (four) times daily as needed (For addiction or blood pressure.).    03/02/2018  . gabapentin (NEURONTIN) 300 MG capsule Take 3 capsules (900 mg total) by mouth 3 (three) times daily. 240 capsule 0 03/02/2018  . hydrOXYzine (ATARAX/VISTARIL) 50 MG tablet Take 1 tablet (50 mg total) by mouth every 6 (six) hours as needed for anxiety. 30 tablet 0 03/02/2018  . lisinopril (PRINIVIL,ZESTRIL) 10 MG tablet Take 1 tablet (10  mg total) by mouth daily. 30 tablet 0 03/02/2018  . propranolol (INDERAL) 20 MG tablet Take 20 mg by mouth 2 (two) times daily.   03/02/2018 at unknown  . QUEtiapine (SEROQUEL) 100 MG tablet Take 100 mg by mouth at bedtime.  1 03/02/2018  . QUEtiapine (SEROQUEL) 50 MG tablet Take 50 mg by mouth daily.  1 03/03/2018  . risperiDONE (RISPERDAL) 0.5 MG tablet Take 1 tablet (0.5 mg total) by mouth daily before breakfast. 30 tablet 0 03/02/2018  . risperiDONE (RISPERDAL) 2 MG tablet Take 1 tablet (2 mg total) by mouth at bedtime. 30 tablet 0 03/02/2018  . ZUBSOLV 5.7-1.4 MG SUBL Place 1 tablet under the tongue 2 (two) times daily.   0 03/02/2018  . nicotine (NICODERM CQ - DOSED IN MG/24 HOURS) 21 mg/24hr patch Place 1 patch (21 mg total) onto the skin daily. (Patient not taking: Reported on 01/17/2018) 28 patch 0 Not Taking at Unknown time  . sertraline (ZOLOFT) 100 MG tablet Take 1 tablet (100 mg total) by  mouth daily. (Patient not taking: Reported on 01/17/2018) 30 tablet 0 Not Taking at Unknown time  . traZODone (DESYREL) 50 MG tablet Take 1 tablet (50 mg total) by mouth at bedtime as needed for sleep. (Patient not taking: Reported on 01/17/2018) 30 tablet 0 Not Taking at Unknown time    Patient Stressors: Medication change or noncompliance Substance abuse  Patient Strengths: Ability for insight Average or above average intelligence Capable of independent living General fund of knowledge  Treatment Modalities: Medication Management, Group therapy, Case management,  1 to 1 session with clinician, Psychoeducation, Recreational therapy.   Physician Treatment Plan for Primary Diagnosis: Severe recurrent major depression without psychotic features (HCC) Long Term Goal(s): Improvement in symptoms so as ready for discharge Improvement in symptoms so as ready for discharge   Short Term Goals: Ability to identify changes in lifestyle to reduce recurrence of condition will improve Ability to verbalize feelings will improve Ability to disclose and discuss suicidal ideas Compliance with prescribed medications will improve Ability to identify triggers associated with substance abuse/mental health issues will improve Ability to disclose and discuss suicidal ideas Ability to demonstrate self-control will improve Ability to identify and develop effective coping behaviors will improve Ability to maintain clinical measurements within normal limits will improve  Medication Management: Evaluate patient's response, side effects, and tolerance of medication regimen.  Therapeutic Interventions: 1 to 1 sessions, Unit Group sessions and Medication administration.  Evaluation of Outcomes: Progressing  Physician Treatment Plan for Secondary Diagnosis: Principal Problem:   Severe recurrent major depression without psychotic features (HCC) Active Problems:   Methamphetamine abuse (HCC)  Long Term Goal(s):  Improvement in symptoms so as ready for discharge Improvement in symptoms so as ready for discharge   Short Term Goals: Ability to identify changes in lifestyle to reduce recurrence of condition will improve Ability to verbalize feelings will improve Ability to disclose and discuss suicidal ideas Compliance with prescribed medications will improve Ability to identify triggers associated with substance abuse/mental health issues will improve Ability to disclose and discuss suicidal ideas Ability to demonstrate self-control will improve Ability to identify and develop effective coping behaviors will improve Ability to maintain clinical measurements within normal limits will improve     Medication Management: Evaluate patient's response, side effects, and tolerance of medication regimen.  Therapeutic Interventions: 1 to 1 sessions, Unit Group sessions and Medication administration.  Evaluation of Outcomes: Progressing   RN Treatment Plan for Primary Diagnosis: Severe recurrent major  depression without psychotic features (HCC) Long Term Goal(s): Knowledge of disease and therapeutic regimen to maintain health will improve  Short Term Goals: Ability to remain free from injury will improve, Ability to verbalize frustration and anger appropriately will improve, Ability to disclose and discuss suicidal ideas and Ability to identify and develop effective coping behaviors will improve  Medication Management: RN will administer medications as ordered by provider, will assess and evaluate patient's response and provide education to patient for prescribed medication. RN will report any adverse and/or side effects to prescribing provider.  Therapeutic Interventions: 1 on 1 counseling sessions, Psychoeducation, Medication administration, Evaluate responses to treatment, Monitor vital signs and CBGs as ordered, Perform/monitor CIWA, COWS, AIMS and Fall Risk screenings as ordered, Perform wound care  treatments as ordered.  Evaluation of Outcomes: Progressing   LCSW Treatment Plan for Primary Diagnosis: Severe recurrent major depression without psychotic features (HCC) Long Term Goal(s): Safe transition to appropriate next level of care at discharge, Engage patient in therapeutic group addressing interpersonal concerns.  Short Term Goals: Engage patient in aftercare planning with referrals and resources, Facilitate patient progression through stages of change regarding substance use diagnoses and concerns and Identify triggers associated with mental health/substance abuse issues  Therapeutic Interventions: Assess for all discharge needs, 1 to 1 time with Social worker, Explore available resources and support systems, Assess for adequacy in community support network, Educate family and significant other(s) on suicide prevention, Complete Psychosocial Assessment, Interpersonal group therapy.  Evaluation of Outcomes: Progressing   Progress in Treatment: Attending groups: No. Participating in groups: No. Taking medication as prescribed: Yes. Toleration medication: Yes. Family/Significant other contact made: SPE completed with pt; pt declined to consent to collateral contact.  Patient understands diagnosis: Yes. Discussing patient identified problems/goals with staff: Yes. Medical problems stabilized or resolved: Yes. Denies suicidal/homicidal ideation: Yes. Issues/concerns per patient self-inventory: No. Other: n/a   New problem(s) identified: No, Describe:  n/a  New Short Term/Long Term Goal(s): detox, medication management for mood stabilization; elimination of SI thoughts; development of comprehensive mental wellness/sobriety plan.   Patient Goals:  "My goal is to get back on medications and get stable."   Discharge Plan or Barriers: CSW assessing. Pt is homeless currently. He has been referred to [facility] for residential treatment. Likely [facility] for outpatient mental  health care. [facility]AG pamphlet, Mobile Crisis information, and AA/NA information provided to patient for additional community support and resources.   Reason for Continuation of Hospitalization: Anxiety Depression Medication stabilization Suicidal ideation Withdrawal symptoms  Estimated Length of Stay: Monday, 03/10/18  Attendees: Patient: 03/05/2018 11:21 AM  Physician: Dr. Jama Flavors MD; Dr. Altamese [place] MD 03/05/2018 11:21 AM  Nursing: Arlyss Repress RN; [facility] RN 03/05/2018 11:21 AM  RN Care Manager:x 03/05/2018 11:21 AM  Social Worker: Corrie Mckusick LCSw 03/05/2018 11:21 AM  Recreational Therapist: x 03/05/2018 11:21 AM  Other: Armandina Stammer NP; Denzil Magnuson NP 03/05/2018 11:21 AM  Other:  03/05/2018 11:21 AM  Other: 03/05/2018 11:21 AM    Scribe for Treatment Team: Rona Ravens, LCSW 03/05/2018 11:21 AM

## 2018-03-05 NOTE — Progress Notes (Signed)
Recreation Therapy Notes  Date: 9.4.19 Time: 0930 Location: [address] Dayroom  Group Topic: Stress Management  Goal Area(s) Addresses:  Patient will verbalize importance of using healthy stress management.  Patient will identify positive emotions associated with healthy stress management.   Intervention: Stress Management  Activity :  Guided Imagery.  LRT introduced the stress management technique of guided imagery.  LRT read a script that allowed patients to enjoy the summer clouds.  Patients were to follow along as LRT read script to engage in activity.  Education:  Stress Management, Discharge Planning.   Education Outcome: Acknowledges edcuation/In group clarification offered/Needs additional education  Clinical Observations/Feedback: Pt did not attend group.     Caroll Rancher, LRT/CTRS         Lillia Abed, Ashaad Gaertner A 03/05/2018 10:56 AM

## 2018-03-05 NOTE — Plan of Care (Signed)
  Problem: Education: Goal: Emotional status will improve Outcome: Progressing   Problem: Education: Goal: Mental status will improve Outcome: Progressing   Problem: Education: Goal: Verbalization of understanding the information provided will improve Outcome: Progressing   Problem: Activity: Goal: Sleeping patterns will improve Outcome: Progressing   

## 2018-03-05 NOTE — Progress Notes (Signed)
Patient self inventory- Patient slept fair last night, appetite has been good, concentration poor. Depression, hopelessness, and anxiety all rated 8 out of 10. Endorses tremors, cravings, agitation, chilling, cramping, and irritability. Endorses passive SI. Contracts for safety. Patient is compliant with medications prescribed per provider. Safety is maintained with 15 minute checks as well as environmental checks. Will continue to monitor.

## 2018-03-05 NOTE — Progress Notes (Addendum)
[facility] MD Progress Note  03/05/2018 10:14 AM Dominic Spencer  MRN:  [id number]  Subjective: " I still fell the same. No big changes."   Evaluation on the unit: Face to face evaluation completed, case discussed with treatment team and chart reviewed. 47 year old patient admitted to the unit following SI with a plan to overdose on heroin. He is known to [facility] as he was recently discharged July, 2019.  He has a history of depression as well as polysubstance abuse that includes methamphetamines and opiates.   During this evaluation, he is alert and oriented x4, calm and cooperative. He endorses no improvement in psychiatric symptoms/conditons.  He continues to endorse ongoing depression, anxiety and passive suicidal thoughts. He denies any plan or intent secondary to his suicidal thoughts. He denies homicidal ideations or AVH and does not appear internally preoccupied. Endorses no concerns with sleep or appetite. Endorses withdrawal symptoms describing symptoms as chills, body aches and skin crawling. He remains on detox protocol for his withdrawal symptoms. He remains open to long term treatment for his substance abuse following discharge. CSW has sent a referral to [facility]. At this time, he is contracting for safety on the unit.     Principal Problem: Severe recurrent major depression without psychotic features ([place]) Diagnosis:   Patient Active Problem List   Diagnosis Date Noted  . 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 Severe recurrent major depression without psychotic features ([place]) [F33.2] 03/03/2018  . Substance induced mood disorder ([place]) [F19.94]   . Cocaine use disorder, severe, dependence ([place]) [F14.20]   . 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 Opioid dependence with opioid-induced disorder ([place]) [F11.29]   . Polysubstance dependence ([place]) [F19.20]   . MDD (major depressive disorder), severe ([place]) [F32.2] 01/03/2018  . Acute pain of left shoulder [M25.512]   .  MDD (major depressive disorder), recurrent severe, without psychosis ([place]) [F33.2] 03/10/2017   Total Time spent with patient: 20 minutes  Past Psychiatric History: Previous inpatient admission, reports a diagnosis of major depressive disorder and insomnia. Reports taking Zoloft, Prozac and Seroquel in the past however denies that he has been medication adherent.  Past Medical History:  Past Medical History:  Diagnosis Date  . Suicidal ideation    History reviewed. No pertinent surgical history. Family History: History reviewed. No pertinent family history. Family Psychiatric  History: None noted  Social History:  Social History   Substance and Sexual Activity  Alcohol Use No  . Frequency: Never     Social History   Substance and Sexual Activity  Drug Use Yes  . Types: Marijuana, Methamphetamines, Heroin    Social History   Socioeconomic History  . Marital status: Single    Spouse name: Not on file  . Number of children: Not on file  . Years of education: Not on file  . Highest education level: Not on file  Occupational History  . Not on file  Social Needs  . Financial resource strain: Not on file  . Food insecurity:    Worry: Not on file    Inability: Not on file  . Transportation needs:    Medical: Not on file    Non-medical: Not on file  Tobacco Use  . Smoking status: Heavy Tobacco Smoker    Packs/day: 1.50    Types: Cigarettes  . Smokeless tobacco: Never Used  Substance and Sexual Activity  . Alcohol use: No    Frequency: Never  . Drug use: Yes  Types: Marijuana, Methamphetamines, Heroin  . Sexual activity: Yes    Birth control/protection: None  Lifestyle  . Physical activity:    Days per week: Not on file    Minutes per session: Not on file  . Stress: Not on file  Relationships  . Social connections:    Talks on phone: Not on file    Gets together: Not on file    Attends religious service: Not on file    Active member of club or organization:  Not on file    Attends meetings of clubs or organizations: Not on file    Relationship status: Not on file  Other Topics Concern  . Not on file  Social History Narrative  . Not on file   Additional Social History:    Pain Medications: see MAR Prescriptions: see MAR Over the Counter: see MAR     Sleep: Fair  Appetite:  Fair  Current Medications: Current Facility-Administered Medications  Medication Dose Route Frequency Provider Last Rate Last Dose  . acetaminophen (TYLENOL) tablet 650 mg  650 mg Oral Q6H PRN Lindon Romp A, NP      . alum & mag hydroxide-simeth (MAALOX/MYLANTA) 200-200-20 MG/5ML suspension 30 mL  30 mL Oral Q4H PRN Lindon Romp A, NP      . cloNIDine (CATAPRES) tablet 0.1 mg  0.1 mg Oral QID Lindon Romp A, NP   0.1 mg at 03/05/18 0816   Followed by  . cloNIDine (CATAPRES) tablet 0.1 mg  0.1 mg Oral BH-qamhs Rozetta Nunnery, NP       Followed by  . [START ON 03/08/2018] cloNIDine (CATAPRES) tablet 0.1 mg  0.1 mg Oral QAC breakfast Lindon Romp A, NP      . dicyclomine (BENTYL) tablet 20 mg  20 mg Oral Q6H PRN Lindon Romp A, NP   20 mg at 03/04/18 2128  . gabapentin (NEURONTIN) capsule 100 mg  100 mg Oral TID , Myer Peer, MD   100 mg at 03/05/18 0816  . hydrOXYzine (ATARAX/VISTARIL) tablet 25 mg  25 mg Oral Q6H PRN Lindon Romp A, NP   25 mg at 03/04/18 2123  . loperamide (IMODIUM) capsule 2-4 mg  2-4 mg Oral PRN Lindon Romp A, NP   2 mg at 03/04/18 0953  . magnesium hydroxide (MILK OF MAGNESIA) suspension 30 mL  30 mL Oral Daily PRN Lindon Romp A, NP      . methocarbamol (ROBAXIN) tablet 500 mg  500 mg Oral Q8H PRN Lindon Romp A, NP   500 mg at 03/04/18 2129  . naproxen (NAPROSYN) tablet 500 mg  500 mg Oral BID PRN Lindon Romp A, NP   500 mg at 03/04/18 2129  . nicotine (NICODERM CQ - dosed in mg/24 hours) patch 21 mg  21 mg Transdermal Daily Sharma Covert, MD   21 mg at 03/05/18 0817  . ondansetron (ZOFRAN-ODT) disintegrating tablet 4 mg  4 mg Oral  Q6H PRN Lindon Romp A, NP      . risperiDONE (RISPERDAL) tablet 1 mg  1 mg Oral QHS , Myer Peer, MD   1 mg at 03/04/18 2126  . sertraline (ZOLOFT) tablet 50 mg  50 mg Oral Daily , Myer Peer, MD   50 mg at 03/05/18 0816  . traZODone (DESYREL) tablet 50 mg  50 mg Oral QHS PRN Rozetta Nunnery, NP   50 mg at 03/03/18 2203    Lab Results:  Results for orders placed or performed during the hospital encounter  of 03/03/18 (from the past 48 hour(s))  Urine rapid drug screen (hosp performed)     Status: Abnormal   Collection Time: 03/04/18  4:47 PM  Result Value Ref Range   Opiates POSITIVE (A) NONE DETECTED   Cocaine NONE DETECTED NONE DETECTED   Benzodiazepines POSITIVE (A) NONE DETECTED   Amphetamines NONE DETECTED NONE DETECTED   Tetrahydrocannabinol NONE DETECTED NONE DETECTED   Barbiturates NONE DETECTED NONE DETECTED    Comment: (NOTE) DRUG SCREEN FOR MEDICAL PURPOSES ONLY.  IF CONFIRMATION IS NEEDED FOR ANY PURPOSE, NOTIFY LAB WITHIN 5 DAYS. LOWEST DETECTABLE LIMITS FOR URINE DRUG SCREEN Drug Class                     Cutoff (ng/mL) Amphetamine and metabolites    1000 Barbiturate and metabolites    200 Benzodiazepine                 151 Tricyclics and metabolites     300 Opiates and metabolites        300 Cocaine and metabolites        300 THC                            50 Performed at [facility], [place] [address], [place], [place] [address]   Lipid panel     Status: Abnormal   Collection Time: 03/05/18  6:32 AM  Result Value Ref Range   Cholesterol 144 0 - 200 mg/dL   Triglycerides 111 <150 mg/dL   HDL 38 (L) >40 mg/dL   Total CHOL/HDL Ratio 3.8 RATIO   VLDL 22 0 - 40 mg/dL   LDL Cholesterol 84 0 - 99 mg/dL    Comment:        Total Cholesterol/HDL:CHD Risk Coronary Heart Disease Risk Table                     Men   Women  1/2 Average Risk   3.4   3.3  Average Risk       5.0   4.4  2 X Average Risk   9.6   7.1  3 X Average Risk  23.4    11.0        Use the calculated Patient Ratio above and the CHD Risk Table to determine the patient's CHD Risk.        ATP III CLASSIFICATION (LDL):  <100     mg/dL   Optimal  100-129  mg/dL   Near or Above                    Optimal  130-159  mg/dL   Borderline  160-189  mg/dL   High  >190     mg/dL   Very High Performed at [place] [address], [place],  [address]   Hemoglobin A1c     Status: None   Collection Time: 03/05/18  6:32 AM  Result Value Ref Range   Hgb A1c MFr Bld 5.4 4.8 - 5.6 %    Comment: (NOTE) Pre diabetes:          5.7%-6.4% Diabetes:              >6.4% Glycemic control for   <7.0% adults with diabetes    Mean Plasma Glucose 108.28 mg/dL    Comment: Performed at [place],  [place] [address]  CBC with Differential/Platelet     Status: None   Collection Time: 03/05/18  6:32 AM  Result Value Ref Range   WBC 4.9 4.0 - 10.5 K/uL   RBC 4.53 4.22 - 5.81 MIL/uL   Hemoglobin 14.2 13.0 - 17.0 g/dL   HCT 40.8 39.0 - 52.0 %   MCV 90.1 78.0 - 100.0 fL   MCH 31.3 26.0 - 34.0 pg   MCHC 34.8 30.0 - 36.0 g/dL   RDW 12.8 11.5 - 15.5 %   Platelets 322 150 - 400 K/uL   Neutrophils Relative % 58 %   Neutro Abs 2.8 1.7 - 7.7 K/uL   Lymphocytes Relative 35 %   Lymphs Abs 1.7 0.7 - 4.0 K/uL   Monocytes Relative 5 %   Monocytes Absolute 0.3 0.1 - 1.0 K/uL   Eosinophils Relative 1 %   Eosinophils Absolute 0.1 0.0 - 0.7 K/uL   Basophils Relative 1 %   Basophils Absolute 0.0 0.0 - 0.1 K/uL    Comment: Performed at [facility], [place] [address], [place], [place] [address]  Comprehensive metabolic panel     Status: Abnormal   Collection Time: 03/05/18  6:32 AM  Result Value Ref Range   Sodium 146 (H) 135 - 145 mmol/L   Potassium 3.9 3.5 - 5.1 mmol/L   Chloride 111 98 - 111 mmol/L   CO2 24 22 - 32 mmol/L   Glucose, Bld 111 (H) 70 - 99 mg/dL   BUN 12 6 - 20 mg/dL   Creatinine, Ser 0.78  0.61 - 1.24 mg/dL   Calcium 8.9 8.9 - 10.3 mg/dL   Total Protein 6.0 (L) 6.5 - 8.1 g/dL   Albumin 3.4 (L) 3.5 - 5.0 g/dL   AST 17 15 - 41 U/L   ALT 20 0 - 44 U/L   Alkaline Phosphatase 75 38 - 126 U/L   Total Bilirubin 0.3 0.3 - 1.2 mg/dL   GFR calc non Af Amer >60 >60 mL/min   GFR calc Af Amer >60 >60 mL/min    Comment: (NOTE) The eGFR has been calculated using the CKD EPI equation. This calculation has not been validated in all clinical situations. eGFR's persistently <60 mL/min signify possible Chronic Kidney Disease.    Anion gap 11 5 - 15    Comment: Performed at [facility], [place] [address], [place], [place] [address]    Blood Alcohol level:  Lab Results  Component Value Date   ETH <10 26/94/8546    Metabolic Disorder Labs: Lab Results  Component Value Date   HGBA1C 5.4 03/05/2018   MPG 108.28 03/05/2018   No results found for: PROLACTIN Lab Results  Component Value Date   CHOL 144 03/05/2018   TRIG 111 03/05/2018   HDL 38 (L) 03/05/2018   CHOLHDL 3.8 03/05/2018   VLDL 22 03/05/2018   LDLCALC 84 03/05/2018    Physical Findings: AIMS: Facial and Oral Movements Muscles of Facial Expression: None, normal Lips and Perioral Area: None, normal Jaw: None, normal Tongue: None, normal,Extremity Movements Upper (arms, wrists, hands, fingers): None, normal Lower (legs, knees, ankles, toes): None, normal, Trunk Movements Neck, shoulders, hips: None, normal, Overall Severity Severity of abnormal movements (highest score from questions above): None, normal Incapacitation due to abnormal movements: None, normal Patient's awareness of abnormal movements (rate only patient's report): No Awareness, Dental Status Current problems with teeth and/or dentures?: No Does patient usually wear dentures?: No  CIWA:    COWS:  COWS Total Score: 6  Musculoskeletal: Strength & Muscle Tone: within normal limits Gait & Station: normal Patient leans:  N/A  Psychiatric Specialty Exam: Physical Exam  Nursing note and vitals reviewed. Constitutional: He is oriented to person, place, and time.  Neurological: He is alert and oriented to person, place, and time.    Review of Systems  Psychiatric/Behavioral: Positive for depression, substance abuse and suicidal ideas. Negative for hallucinations and memory loss. The patient is nervous/anxious. The patient does not have insomnia.   All other systems reviewed and are negative.   Blood pressure 133/79, pulse 81, temperature 99 F (37.2 C), temperature source Oral, resp. rate 18, height 6' 2" (1.88 m), weight 86.2 kg.Body mass index is 24.39 kg/m.  General Appearance: Fairly Groomed  Eye Contact:  Good  Speech:  Clear and Coherent and Normal Rate  Volume:  Normal  Mood:  Anxious and Depressed  Affect:  Depressed  Thought Process:  Coherent, Goal Directed, Linear and Descriptions of Associations: Intact  Orientation:  Full (Time, Place, and Person)  Thought Content:  Logical  Suicidal Thoughts:  Yes.  without intent/plan  Homicidal Thoughts:  No  Memory:  Immediate;   Fair Recent;   Fair  Judgement:  Fair  Insight:  Fair  Psychomotor Activity:  Normal  Concentration:  Concentration: Fair and Attention Span: Fair  Recall:  [employer] of Knowledge:  Fair  Language:  Good  Akathisia:  Negative  Handed:  Right  AIMS (if indicated):     Assets:  Communication Skills Desire for Improvement Resilience Social Support  ADL's:  Intact  Cognition:  WNL  Sleep:  Number of Hours: 6.25     Treatment Plan Summary: Reviewed current treatment plan, Will continue the following plan without adjustments at this time.   Daily contact with patient to assess and evaluate symptoms and progress in treatment   Depression' severe recurrent without psychosis.-Not improving. Continue Zoloft 50 mgrs QDAY .  Mood stabilization-Not improving. Continue  Risperidone 1 mgr daily at bedtime  Sleep  disturbance-Stable. Continue Trazodone 50 mg po daily at bedtime as needed for sleep,   Anxiety-Not improving. Continue Vistaril 25 mg po q6hrs  as needed and Neurontin 100 mg po TID for anxiety. Will increased Neurontin ff anxiety does not improve over the upcoming days. ?  Labs: Hep B/C/HIV testing completed yet not resulted.  Ordered lipid panel HDL 38 otherwise normal.  CBC with diff  and HgbA1c normal. CMP sodium 146, glucose 111, total protein 6.0, albumin 3.4 otherwise normal. UDS positive for opiates and benzodiazepines. . EKG reviewed which is normal.     Mordecai Maes, NP 03/05/2018, 10:14 AM   ..Agree with NP Progress Note

## 2018-03-05 NOTE — [facility] Group Notes (Signed)
LCSW Group Therapy Note 03/05/2018 11:33 AM  Type of Therapy and Topic: Group Therapy: Overcoming Obstacles  Participation Level: Did Not Attend  Description of Group:  In this group patients will be encouraged to explore what they see as obstacles to their own wellness and recovery. They will be guided to discuss their thoughts, feelings, and behaviors related to these obstacles. The group will process together ways to cope with barriers, with attention given to specific choices patients can make. Each patient will be challenged to identify changes they are motivated to make in order to overcome their obstacles. This group will be process-oriented, with patients participating in exploration of their own experiences as well as giving and receiving support and challenge from other group members.  Therapeutic Goals: 1. Patient will identify personal and current obstacles as they relate to admission. 2. Patient will identify barriers that currently interfere with their wellness or overcoming obstacles.  3. Patient will identify feelings, thought process and behaviors related to these barriers. 4. Patient will identify two changes they are willing to make to overcome these obstacles:   Summary of Patient Progress  Invited, chose not to attend.    Therapeutic Modalities:  Cognitive Behavioral Therapy Solution Focused Therapy Motivational Interviewing Relapse Prevention Therapy   Hanad Leino LCSWA Clinical Social Worker   

## 2018-03-05 NOTE — Progress Notes (Signed)
At the beginning of the shift, pt was c/o a dry, scratchy throat for which he received Cepacol lozenges.  He reports he has felt "rough" today with significant withdrawal symptoms.  He has passive suicidal thoughts, but can contract for safety.  He denies HI/AVH.  He has been observed in the dayroom most of the evening socializing with peers.  He makes his needs known to staff.  He has been medicated per the detox protocol for his withdrawal symptoms.  Support and encouragement offered.  Discharge plans are in process.  Pt plans to go for rehab after detox.  Safety maintained with q15 minute checks.

## 2018-03-06 LAB — HEPATITIS C ANTIBODY

## 2018-03-06 LAB — HEPATITIS B SURFACE ANTIGEN: HEP B S AG: NEGATIVE

## 2018-03-06 MED ORDER — GABAPENTIN 300 MG PO CAPS
300.0000 mg | ORAL_CAPSULE | Freq: Three times a day (TID) | ORAL | Status: DC
  Administered 2018-03-06 – 2018-03-07 (×4): 300 mg via ORAL
  Filled 2018-03-06: qty 1
  Filled 2018-03-06: qty 63
  Filled 2018-03-06 (×2): qty 1
  Filled 2018-03-06: qty 63
  Filled 2018-03-06 (×4): qty 1
  Filled 2018-03-06: qty 63
  Filled 2018-03-06: qty 1

## 2018-03-06 MED ORDER — CLONIDINE HCL 0.1 MG PO TABS
0.2000 mg | ORAL_TABLET | Freq: Four times a day (QID) | ORAL | Status: DC | PRN
  Administered 2018-03-06 – 2018-03-07 (×2): 0.2 mg via ORAL
  Filled 2018-03-06: qty 2

## 2018-03-06 NOTE — Progress Notes (Signed)
[facility] MD Progress Note  03/06/2018 1:24 PM Dominic Spencer  MRN:  [id number]   Subjective: Patient reports that he is feeling better than on admission , and describes improving depression, improving anxiety .  Describes mild residual symptoms of opiate withdrawal , which she describes as "skin crawling " sensation but states it is mild and improved .  At present does not endorse vomiting or diarrhea and is tolerating p.o. well .  Currently future oriented and focused on going to a rehab at discharge .  Denies medication side effects .   Objective: Patient seen and case discussed with treatment team. As above, at this time patient reports partial but significant improvement compared to admission presentation.  He does present with a fuller range of affect and currently presents calm,  in no acute distress or discomfort. Currently he is denying suicidal ideations and is presenting future oriented, with a plan of going to a residential rehab setting ( [facility]) at discharge.  He is also expressed interest in going to [facility] as an alternative disposition plan.  No disruptive or agitated behaviors on unit.  No severe opiate withdrawal symptoms at this time, presents comfortable and in no acute distress. Visible on unit, cooperative on approach, limited group participation.   Principal Problem: Severe recurrent major depression without psychotic features ([place]) Diagnosis:   Patient Active Problem List   Diagnosis Date Noted  . 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 Severe recurrent major depression without psychotic features ([place]) [F33.2] 03/03/2018  . Substance induced mood disorder ([place]) [F19.94]   . Cocaine use disorder, severe, dependence ([place]) [F14.20]   . 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 Opioid dependence with opioid-induced disorder ([place]) [F11.29]   . Polysubstance dependence ([place]) [F19.20]   . MDD (major depressive disorder), severe ([place]) [F32.2]  01/03/2018  . Acute pain of left shoulder [M25.512]   . MDD (major depressive disorder), recurrent severe, without psychosis ([place]) [F33.2] 03/10/2017   Total Time spent with patient: 20 minutes  Past Psychiatric History: See H&P  Past Medical History:  Past Medical History:  Diagnosis Date  . Suicidal ideation    History reviewed. No pertinent surgical history. Family History: History reviewed. No pertinent family history. Family Psychiatric  History: See H&P Social History:  Social History   Substance and Sexual Activity  Alcohol Use No  . Frequency: Never     Social History   Substance and Sexual Activity  Drug Use Yes  . Types: Marijuana, Methamphetamines, Heroin    Social History   Socioeconomic History  . Marital status: Single    Spouse name: Not on file  . Number of children: Not on file  . Years of education: Not on file  . Highest education level: Not on file  Occupational History  . Not on file  Social Needs  . Financial resource strain: Not on file  . Food insecurity:    Worry: Not on file    Inability: Not on file  . Transportation needs:    Medical: Not on file    Non-medical: Not on file  Tobacco Use  . Smoking status: Heavy Tobacco Smoker    Packs/day: 1.50    Types: Cigarettes  . Smokeless tobacco: Never Used  Substance and Sexual Activity  . Alcohol use: No    Frequency: Never  . Drug use: Yes    Types: Marijuana, Methamphetamines, Heroin  . Sexual activity: Yes    Birth control/protection: None  Lifestyle  . Physical activity:    Days per week: Not on file    Minutes per session: Not on file  . Stress: Not on file  Relationships  . Social connections:    Talks on phone: Not on file    Gets together: Not on file    Attends religious service: Not on file    Active member of club or organization: Not on file    Attends meetings of clubs or organizations: Not on file    Relationship status: Not on file  Other Topics Concern  . Not  on file  Social History Narrative  . Not on file   Additional Social History:    Pain Medications: see MAR Prescriptions: see MAR Over the Counter: see MAR  Sleep: Good  Appetite:  Improving  Current Medications: Current Facility-Administered Medications  Medication Dose Route Frequency Provider Last Rate Last Dose  . acetaminophen (TYLENOL) tablet 650 mg  650 mg Oral Q6H PRN Lindon Romp A, NP   650 mg at 03/05/18 1550  . alum & mag hydroxide-simeth (MAALOX/MYLANTA) 200-200-20 MG/5ML suspension 30 mL  30 mL Oral Q4H PRN Lindon Romp A, NP      . cloNIDine (CATAPRES) tablet 0.1 mg  0.1 mg Oral BH-qamhs Lindon Romp A, NP   0.1 mg at 03/06/18 0813   Followed by  . [START ON 03/08/2018] cloNIDine (CATAPRES) tablet 0.1 mg  0.1 mg Oral QAC breakfast Lindon Romp A, NP      . dicyclomine (BENTYL) tablet 20 mg  20 mg Oral Q6H PRN Lindon Romp A, NP   20 mg at 03/06/18 4562  . gabapentin (NEURONTIN) capsule 300 mg  300 mg Oral TID Ahria Slappey, Myer Peer, MD   300 mg at 03/06/18 1215  . hydrOXYzine (ATARAX/VISTARIL) tablet 25 mg  25 mg Oral Q6H PRN Lindon Romp A, NP   25 mg at 03/06/18 5638  . loperamide (IMODIUM) capsule 2-4 mg  2-4 mg Oral PRN Lindon Romp A, NP   2 mg at 03/04/18 0953  . magnesium hydroxide (MILK OF MAGNESIA) suspension 30 mL  30 mL Oral Daily PRN Lindon Romp A, NP      . methocarbamol (ROBAXIN) tablet 500 mg  500 mg Oral Q8H PRN Lindon Romp A, NP   500 mg at 03/06/18 0823  . naproxen (NAPROSYN) tablet 500 mg  500 mg Oral BID PRN Lindon Romp A, NP   500 mg at 03/06/18 9373  . nicotine (NICODERM CQ - dosed in mg/24 hours) patch 21 mg  21 mg Transdermal Daily Sharma Covert, MD   21 mg at 03/06/18 0815  . ondansetron (ZOFRAN-ODT) disintegrating tablet 4 mg  4 mg Oral Q6H PRN Lindon Romp A, NP      . risperiDONE (RISPERDAL) tablet 1 mg  1 mg Oral QHS Bartley Vuolo, Myer Peer, MD   1 mg at 03/05/18 2116  . sertraline (ZOLOFT) tablet 50 mg  50 mg Oral Daily Shoshanah Dapper, Myer Peer, MD    50 mg at 03/06/18 0814  . traZODone (DESYREL) tablet 50 mg  50 mg Oral QHS PRN Rozetta Nunnery, NP   50 mg at 03/05/18 2116    Lab Results:  Results for orders placed or performed during the hospital encounter of 03/03/18 (from the past 48 hour(s))  Urine rapid drug screen (hosp performed)     Status: Abnormal   Collection Time: 03/04/18  4:47 PM  Result Value Ref Range   Opiates POSITIVE (A) NONE DETECTED  Cocaine NONE DETECTED NONE DETECTED   Benzodiazepines POSITIVE (A) NONE DETECTED   Amphetamines NONE DETECTED NONE DETECTED   Tetrahydrocannabinol NONE DETECTED NONE DETECTED   Barbiturates NONE DETECTED NONE DETECTED    Comment: (NOTE) DRUG SCREEN FOR MEDICAL PURPOSES ONLY.  IF CONFIRMATION IS NEEDED FOR ANY PURPOSE, NOTIFY LAB WITHIN 5 DAYS. LOWEST DETECTABLE LIMITS FOR URINE DRUG SCREEN Drug Class                     Cutoff (ng/mL) Amphetamine and metabolites    1000 Barbiturate and metabolites    200 Benzodiazepine                 818 Tricyclics and metabolites     300 Opiates and metabolites        300 Cocaine and metabolites        300 THC                            50 Performed at [facility], [place] [address] [place], [place] [address]   HIV antibody     Status: None   Collection Time: 03/05/18  6:32 AM  Result Value Ref Range   HIV Screen 4th Generation wRfx Non Reactive Non Reactive    Comment: (NOTE) Performed At: [facility] [place], [place] [id number] Rush Farmer MD [id number]   Hepatitis C antibody     Status: None   Collection Time: 03/05/18  6:32 AM  Result Value Ref Range   HCV Ab <0.1 0.0 - 0.9 s/co ratio    Comment: (NOTE)                                  Negative:     < 0.8                             Indeterminate: 0.8 - 0.9                                  Positive:     > 0.9 The CDC recommends that a positive HCV antibody result be followed up with a HCV Nucleic Acid Amplification test  (867672). Performed At: [facility] [place], [place] [id number] Rush Farmer MD [id number]   Hepatitis B surface antigen     Status: None   Collection Time: 03/05/18  6:32 AM  Result Value Ref Range   Hepatitis B Surface Ag Negative Negative    Comment: (NOTE) Performed At: [facility] [place], [place] [id number] Rush Farmer MD [id number]   Lipid panel     Status: Abnormal   Collection Time: 03/05/18  6:32 AM  Result Value Ref Range   Cholesterol 144 0 - 200 mg/dL   Triglycerides 111 <150 mg/dL   HDL 38 (L) >40 mg/dL   Total CHOL/HDL Ratio 3.8 RATIO   VLDL 22 0 - 40 mg/dL   LDL Cholesterol 84 0 - 99 mg/dL    Comment:        Total Cholesterol/HDL:CHD Risk Coronary Heart Disease Risk Table                     Men   Women  1/2 Average  Risk   3.4   3.3  Average Risk       5.0   4.4  2 X Average Risk   9.6   7.1  3 X Average Risk  23.4   11.0        Use the calculated Patient Ratio above and the CHD Risk Table to determine the patient's CHD Risk.        ATP III CLASSIFICATION (LDL):  <100     mg/dL   Optimal  100-129  mg/dL   Near or Above                    Optimal  130-159  mg/dL   Borderline  160-189  mg/dL   High  >190     mg/dL   Very High Performed at [facility] [address], [place], [place] [address]   Hemoglobin A1c     Status: None   Collection Time: 03/05/18  6:32 AM  Result Value Ref Range   Hgb A1c MFr Bld 5.4 4.8 - 5.6 %    Comment: (NOTE) Pre diabetes:          5.7%-6.4% Diabetes:              >6.4% Glycemic control for   <7.0% adults with diabetes    Mean Plasma Glucose 108.28 mg/dL    Comment: Performed at [facility] [address], [place], [place] [address]  CBC with Differential/Platelet     Status: None   Collection Time: 03/05/18  6:32 AM  Result Value Ref Range   WBC 4.9 4.0 - 10.5 K/uL   RBC 4.53 4.22 - 5.81 MIL/uL   Hemoglobin 14.2 13.0 - 17.0  g/dL   HCT 40.8 39.0 - 52.0 %   MCV 90.1 78.0 - 100.0 fL   MCH 31.3 26.0 - 34.0 pg   MCHC 34.8 30.0 - 36.0 g/dL   RDW 12.8 11.5 - 15.5 %   Platelets 322 150 - 400 K/uL   Neutrophils Relative % 58 %   Neutro Abs 2.8 1.7 - 7.7 K/uL   Lymphocytes Relative 35 %   Lymphs Abs 1.7 0.7 - 4.0 K/uL   Monocytes Relative 5 %   Monocytes Absolute 0.3 0.1 - 1.0 K/uL   Eosinophils Relative 1 %   Eosinophils Absolute 0.1 0.0 - 0.7 K/uL   Basophils Relative 1 %   Basophils Absolute 0.0 0.0 - 0.1 K/uL    Comment: Performed at [facility], [place] [address] [place], [place] [address]  Comprehensive metabolic panel     Status: Abnormal   Collection Time: 03/05/18  6:32 AM  Result Value Ref Range   Sodium 146 (H) 135 - 145 mmol/L   Potassium 3.9 3.5 - 5.1 mmol/L   Chloride 111 98 - 111 mmol/L   CO2 24 22 - 32 mmol/L   Glucose, Bld 111 (H) 70 - 99 mg/dL   BUN 12 6 - 20 mg/dL   Creatinine, Ser 0.78 0.61 - 1.24 mg/dL   Calcium 8.9 8.9 - 10.3 mg/dL   Total Protein 6.0 (L) 6.5 - 8.1 g/dL   Albumin 3.4 (L) 3.5 - 5.0 g/dL   AST 17 15 - 41 U/L   ALT 20 0 - 44 U/L   Alkaline Phosphatase 75 38 - 126 U/L   Total Bilirubin 0.3 0.3 - 1.2 mg/dL   GFR calc non Af Amer >60 >60 mL/min   GFR calc  Af Amer >60 >60 mL/min    Comment: (NOTE) The eGFR has been calculated using the CKD EPI equation. This calculation has not been validated in all clinical situations. eGFR's persistently <60 mL/min signify possible Chronic Kidney Disease.    Anion gap 11 5 - 15    Comment: Performed at [facility], [place] [address] [place], [place] [address]    Blood Alcohol level:  Lab Results  Component Value Date   ETH <10 12/30/1599    Metabolic Disorder Labs: Lab Results  Component Value Date   HGBA1C 5.4 03/05/2018   MPG 108.28 03/05/2018   No results found for: PROLACTIN Lab Results  Component Value Date   CHOL 144 03/05/2018   TRIG 111 03/05/2018   HDL 38 (L) 03/05/2018    CHOLHDL 3.8 03/05/2018   VLDL 22 03/05/2018   LDLCALC 84 03/05/2018    Physical Findings: AIMS: Facial and Oral Movements Muscles of Facial Expression: None, normal Lips and Perioral Area: None, normal Jaw: None, normal Tongue: None, normal,Extremity Movements Upper (arms, wrists, hands, fingers): None, normal Lower (legs, knees, ankles, toes): None, normal, Trunk Movements Neck, shoulders, hips: None, normal, Overall Severity Severity of abnormal movements (highest score from questions above): None, normal Incapacitation due to abnormal movements: None, normal Patient's awareness of abnormal movements (rate only patient's report): No Awareness, Dental Status Current problems with teeth and/or dentures?: No Does patient usually wear dentures?: No  CIWA:    COWS:  COWS Total Score: 8  Musculoskeletal: Strength & Muscle Tone: within normal limits Gait & Station: normal Patient leans: N/A  Psychiatric Specialty Exam: Physical Exam  Nursing note and vitals reviewed. Constitutional: He is oriented to person, place, and time. He appears well-developed and well-nourished.  Cardiovascular: Normal rate.  Respiratory: Effort normal.  Musculoskeletal: Normal range of motion.  Neurological: He is alert and oriented to person, place, and time.  Skin: Skin is warm.    Review of Systems  Constitutional: Negative.   HENT: Negative.   Eyes: Negative.   Respiratory: Negative.   Cardiovascular: Negative.   Gastrointestinal: Negative.   Genitourinary: Negative.   Musculoskeletal: Negative.   Skin: Negative.   Neurological: Negative.   Endo/Heme/Allergies: Negative.   Psychiatric/Behavioral: Positive for depression. Negative for hallucinations and suicidal ideas. The patient is not nervous/anxious and does not have insomnia.   No nausea, no vomiting, no diarrhea  Blood pressure (!) 138/99, pulse 90, temperature 99 F (37.2 C), temperature source Oral, resp. rate 18, height 6' 2"   (1.88 m), weight 86.2 kg.Body mass index is 24.39 kg/m.  General Appearance: Casual  Eye Contact:  Good  Speech:  Normal Rate  Volume:  Normal  Mood:  Improving mood, more reactive affect  Affect:  Improving affect  Thought Process:  Goal Directed and Descriptions of Associations: Intact  Orientation:  Other:  Fully alert and attentive  Thought Content:  No hallucinations, no delusions, not internally preoccupied  Suicidal Thoughts:  No-denies current suicidal ideations or self-injurious thoughts and contracts for safety on unit  Homicidal Thoughts:  No-denies homicidal or violent ideations  Memory: Recent and remote grossly intact  Judgement:  Fair-improving  Insight:  Fair-improving  Psychomotor Activity:  Normal-no psychomotor restlessness, appears comfortable  Concentration:  Concentration: Good and Attention Span: Good  Recall:  Good  Fund of Knowledge:  Good  Language:  Good  Akathisia:  No  Handed:  Right  AIMS (if indicated):     Assets:  Communication Skills Desire for Improvement Financial  Resources/Insurance Physical Health Resilience  ADL's:  Intact  Cognition:  WNL  Sleep:  Number of Hours: 6.5   Assessment -47 years old male, homeless, known to [place] from prior admissions, history of methamphetamine and opiate dependencies.  Presented for worsening depression and suicidal thoughts, relapse. Currently reports improving opiate withdrawal symptoms and does not appear to be in any acute distress or discomfort.  Denies suicidal ideations.  He is future oriented and more focused on disposition plans, at this time states she wants to go to a rehab at discharge.  No medication side effects thus far.  Treatment Plan Summary: Daily contact with patient to assess and evaluate symptoms and progress in treatment, Medication management and Plan is to:  Treatment plan reviewed as below today 9/5 Encourage efforts to work on sobriety and relapse prevention Increase Neurontin to 300  mg p.o. 3 times daily for anxiety/pain Continue Clonidine detox protocol to minimize symptoms of opiate withdrawal Continue Vistaril 25 mg p.o. every 6 hours as needed for anxiety Continue Rsperidone 1 mg p.o. nightly for mood disorder Continue Zoloft 50 mg p.o. daily for depression Continue Trazodone 50 mg p.o. nightly as needed for insomnia Treatment team working on disposition planning, see above  F Braxley Balandran MD

## 2018-03-06 NOTE — Progress Notes (Signed)
Nursing Note: 0700-1900  D:  Pt presents with anxious mood and affect. States that he is looking forward to getting help and is hoping to get into [facility].  A:  Encouraged to verbalize needs and concerns, active listening and support provided.  Continued Q 15 minute safety checks.  Observed active participation in group settings.  R:  Pt. pleasant and cooperative.  Denies A/V hallucinations and is able to verbally contract for safety.

## 2018-03-06 NOTE — Progress Notes (Addendum)
[facility] Group Notes:  (Nursing/MHT/Case Management/Adjunct)  Date:  03/06/2018  Time:  2100  Type of Therapy:  wrap group  Participation Level:  Active  Participation Quality:  Appropriate, Attentive, Sharing and Supportive  Affect:  Appropriate and Excited  Cognitive:  Appropriate  Insight:  Improving  Engagement in Group:  Engaged and Supportive  Modes of Intervention:  Clarification, Education and Support  Summary of Progress/Problems: Pt reports feeling good about receiving news that he has been accepted to [facility]. Pt is hoping to discharge tomorrow morning. If pt could change any one thing about his life it would be to be able to stop doing drugs. Pt is grateful for his life.   Marcille Buffy 03/06/2018, 9:44 PM

## 2018-03-06 NOTE — Progress Notes (Signed)
Pt isolates I room.  Pt c/o of not feeling well due to withdrawal symptoms. Pt c/o of anxiety and depression and asks for "whatever he can take to knock him out." Pt does verbally contract for safety, denies SI, HI and AVH.   Pt is med compliant Pt remains safe on unit.

## 2018-03-07 DIAGNOSIS — F332 Major depressive disorder, recurrent severe without psychotic features: Principal | ICD-10-CM

## 2018-03-07 MED ORDER — TRAZODONE HCL 50 MG PO TABS: 50.0000 mg | ORAL_TABLET | Freq: Every evening | ORAL | 0 refills | Status: AC | PRN

## 2018-03-07 MED ORDER — GABAPENTIN 300 MG PO CAPS: 300.0000 mg | ORAL_CAPSULE | Freq: Three times a day (TID) | ORAL | 0 refills | Status: AC

## 2018-03-07 MED ORDER — SERTRALINE HCL 50 MG PO TABS: 50.0000 mg | ORAL_TABLET | Freq: Every day | ORAL | 0 refills | Status: AC

## 2018-03-07 MED ORDER — HYDROXYZINE HCL 25 MG PO TABS: 25.0000 mg | ORAL_TABLET | Freq: Four times a day (QID) | ORAL | 0 refills | Status: AC | PRN

## 2018-03-07 MED ORDER — AMLODIPINE BESYLATE 5 MG PO TABS: 5.0000 mg | ORAL_TABLET | Freq: Every day | ORAL | Status: DC

## 2018-03-07 MED ORDER — AMLODIPINE BESYLATE 5 MG PO TABS: 5.0000 mg | ORAL_TABLET | Freq: Every day | ORAL | 0 refills | Status: AC

## 2018-03-07 MED ORDER — NICOTINE 21 MG/24HR TD PT24: 21.0000 mg | MEDICATED_PATCH | Freq: Every day | TRANSDERMAL | 0 refills | Status: AC

## 2018-03-07 MED ORDER — RISPERIDONE 1 MG PO TABS: 1.0000 mg | ORAL_TABLET | Freq: Every day | ORAL | 0 refills | Status: AC

## 2018-03-07 MED ORDER — AMLODIPINE BESYLATE 5 MG PO TABS
5.0000 mg | ORAL_TABLET | Freq: Every day | ORAL | Status: DC
  Administered 2018-03-07: 5 mg via ORAL
  Filled 2018-03-07: qty 21
  Filled 2018-03-07 (×2): qty 1

## 2018-03-07 MED ORDER — CLONIDINE HCL 0.2 MG PO TABS: 0.2000 mg | ORAL_TABLET | Freq: Four times a day (QID) | ORAL | 0 refills | Status: DC | PRN

## 2018-03-07 NOTE — [facility] Suicide Risk Assessment (Signed)
[facility] Discharge Suicide Risk Assessment   Principal Problem: Severe recurrent major depression without psychotic features [facility]) Discharge Diagnoses:  Patient Active Problem List   Diagnosis Date Noted  . 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 Severe recurrent major depression without psychotic features ([facility]CC) [F33.2] 03/03/2018  . Substance induced mood disorder (HCC) [F19.94]   . Cocaine use disorder, severe, dependence (HCC) [F14.20]   . 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 Polysubstance dependence (HCC) [F19.20]   . MDD (major depressive disorder), severe (HCC) [F32.2] 01/03/2018  . Acute pain of left shoulder [M25.512]   . MDD (major depressive disorder), recurrent severe, without psychosis (HCC) [F33.2] 03/10/2017    Total Time spent with patient: 45 minutes  Musculoskeletal: Strength & Muscle Tone: within normal limits Gait & Station: normal Patient leans: N/A  Psychiatric Specialty Exam: ROS mild headache, no chest pain, no shortness of breath, no vomiting , no fever, no chills   Blood pressure (!) 143/109, pulse (!) 101, temperature 98.4 F (36.9 C), temperature source Oral, resp. rate 16, height 6\' 2"  (1.88 m), weight 86.2 kg.Body mass index is 24.39 kg/m.  Repeat BP following Clonidine  144/82   General Appearance: Well Groomed  Eye Contact::  Good  Speech:  Normal Rate409  Volume:  Normal  Mood:  improved mood , reports " I feel good today", denies depression at this time  Affect:  Appropriate and reactive  Thought Process:  Linear and Descriptions of Associations: Intact  Orientation:  Full (Time, Place, and Person)  Thought Content:  no hallucinations, no delusions, not internally preoccupied   Suicidal Thoughts:  No denies suicidal or self injurious ideations, no homicidal or violent ideations  Homicidal Thoughts:  No  Memory:  recent and remote grossly intact    Judgement:  Other:  improving   Insight:  improving  Psychomotor Activity:  Normal  Concentration:  Good  Recall:  Good  Fund of Knowledge:Good  Language: Good  Akathisia:  Negative  Handed:  Right  AIMS (if indicated):     Assets:  Desire for Improvement Resilience  Sleep:  Number of Hours: 5.25  Cognition: WNL  ADL's:  Intact   Mental Status Per Nursing Assessment::   On Admission:  Suicidal ideation indicated by patient, Self-harm thoughts  Demographic Factors:  47 , single, no children, currently homeless, on disability  Loss Factors: Homelessness, disability, substance abuse   Historical Factors: History of substance abuse ( opiates are substance of choice ), history of depression, history of prior admissions, history of prior suicide attempt about 10 years ago  Risk Reduction Factors:   Positive coping skills or problem solving skills  Continued Clinical Symptoms:  At this time patient is alert, attentive, well related, calm, mood improved , states he is feeling a lot better than on admission, affect appropriate,reactive, no thought disorder, no suicidal or self injurious ideations, no homicidal or violent ideations, future oriented . Denies medication side effects. Behavior on unit calm, in good control. No residual withdrawal symptoms. Patient has presented  elevated BP. History of HTN, states he has not been taking any antihypertensive medications in more than a year. Reviewed with hospitalist consultant - recommendation to start  Amlodipine 5 mgrs QDAY. No contraindication for discharge today. Labs reviewed- Hep B, C , HIV negative.    Cognitive Features That Contribute To Risk:  No gross cognitive deficits noted upon discharge. Is alert , attentive, and  oriented x 3   Suicide Risk:  Mild:  Suicidal ideation of limited frequency, intensity, duration, and specificity.  There are no identifiable plans, no associated intent, mild dysphoria and related symptoms,  good self-control (both objective and subjective assessment), few other risk factors, and identifiable protective factors, including available and accessible social support.  Follow-up Information    [facility] Follow up.   Specialty:  Addiction Medicine Why:  Accepted for admission on 03/07/18.  Contact information: [address] [place] [address] [phone]        [facility] Follow up.   Specialty:  Behavioral Health Why:  Walk-in hours are Monday-Friday from 8:00am-5:00pm. Please be sure to bring your Photo ID, SSN, insurance information, and any discharge paperwork from this hospitalization.  Contact information: [address] [place] [address] [phone]           Plan Of Care/Follow-up recommendations:  Activity:  as tolerated  Diet:  heart healthy Tests:  NA Other:  see below  Patient expressing readiness for discharge . Plans on going to [facility].  Follow up as above . We reviewed importance of following up with PCP for ongoing management and treatment of HTN.   Craige Cotta, MD 03/07/2018, 9:37 AM

## 2018-03-07 NOTE — Progress Notes (Signed)
  [facility] Adult Case Management Discharge Plan :  Will you be returning to the same living situation after discharge:  No. Patient is discharging to [facility] for residential treatment.  At discharge, do you have transportation home?: Yes,  [facility] representative will transport patient at 1:00pm. Do you have the ability to pay for your medications: No.  Release of information consent forms completed and in the chart;  Patient's signature needed at discharge.  Patient to Follow up at: Follow-up Information    [facility] Follow up.   Specialty:  Addiction Medicine Why:  Accepted for admission on 03/07/18.  Contact information: [address] [place] [address] [phone]        [facility] Follow up.   Specialty:  Behavioral Health Why:  Walk-in hours are Monday-Friday from 8:00am-5:00pm. Please be sure to bring your Photo ID, SSN, insurance information, and any discharge paperwork from this hospitalization.  Contact information: [address] [place] [address] [phone]           Next level of care provider has access to [facility]:yes  Safety Planning and Suicide Prevention discussed: Yes,  with patient  Have you used any form of tobacco in the last 30 days? (Cigarettes, Smokeless Tobacco, Cigars, and/or Pipes): Yes  Has patient been referred to the Quitline?: Patient refused referral  Patient has been referred for addiction treatment: Yes  Maeola Sarah, LCSWA 03/07/2018, 9:32 AM

## 2018-03-07 NOTE — Progress Notes (Signed)
   Phone consult with Dr Jama Flavors Patient with history of hypertension admitted to [facility] for polysubstance abuse including cocaine    1)HTN--stage II treat empirically with amlodipine 5 mg daily , avoid vasoconstrictors due to cocaine abuse,  low-salt diet advised   Hospitalist service will sign off at this time please call back as needed

## 2018-03-07 NOTE — Progress Notes (Signed)
Patient accepted to [facility] for residential treatment.   Patient will need a 21-day supply of medications prior to discharge.  Patient will be transported to [facility] by representative at 1:00pm, 03/07/18.   CSW will notify the patient's MD and RN staff.    Baldo Daub, MSW, LCSWA Clinical Social Worker [facility]  Phone: [phone]

## 2018-03-07 NOTE — Progress Notes (Signed)
Patient ID: Dominic Spencer, male   DOB: 12/15/1970, 47 y.o.   MRN: [id number] D: Patient pleasant on the unit. Pt anxious about his elevated blood pressure which he has not been no medication for a while. Barbara Cower NP notified and new order given. Pt denies SI/HI/AVH and pain. Pt thought process is organized and behavior is appropriate. Pt attended and participated in evening wrap up group. Cooperative with assessment.  A: Medications administered as prescribed. Support and encouragement provided. Pt encouraged to discuss feelings and come to staff with any question or concerns.  R: Patient remains safe and complaint with medications.

## 2018-03-07 NOTE — Progress Notes (Signed)
Patient states he is ready to go and bed available for him at [facility] today . He completed his daily assessment and on this he wrote he denied havign SI today and he rated his depression, hopelessness and anxiety " 6/6/7", respectively. He is given prn clonidine this am for BP 143/109. He is started on norvasc 5 mg p QD today and he tolerates this. His dc teaching is completed by EA RN and he is given cc of f/u instructions ( SRA, AVS, SSP and transition record, along with prescriptions and 21 day supply of his maintenace meds. All belongings in his locker were returned to him and he was escorted to bldg entrance and dc'd with [facility] providing transportation.

## 2018-03-07 NOTE — Discharge Summary (Addendum)
Physician Discharge Summary Note  Patient:  Dominic Spencer is an 47 y.o., male MRN:  [id number] DOB:  Mar 27, 1971 Patient phone:  [phone] (home)  Patient address:   [address] [place] [address],  Total Time spent with patient: 20 minutes  Date of Admission:  03/03/2018 Date of Discharge: 01/14/2018  Reason for Admission: 47 year old male, no children, unemployed (on disability) , currently homeless. Patient is well known to [facility] as he was recently discharged July, 2019. He presents with a psychiatric history of of depression as well as polysubstance abuse that includes methamphetamines and opiates. He states, following his most recent discharge, he was doing well for a period of a couple of weeks then stated he started using again and, "everything went down hill." Reports he relapsed about two weeks ago and reports daily methamphetamine use since then. He states he has too used using heroin daily over the last couple of weeks. Reports prior to admission and after relapsing, he started to have suicidal thoughts with a plan to overdose on heroin. He states, his depression worsened at that time. During his last discharge he was on Zoloft and Risperidone which he reports were helpful and well tolerated. He reports he stopped the medications a few days ago. Reports he is interested in restarting his medications and interested in long term substance abuse treatment following discharge. He denies any medical history. No family history of mental health issues noted. He reports he has been on suboxone for substance abuse treatment although he was advised that this medication would not be resumed during this hospital course. He denies HI or AVH and does not appear internally preoccupied. Endorse both depression and anxiety.    Associated Signs/Symptoms: Depression Symptoms:  depressed mood, suicidal thoughts with specific plan, anxiety, (Hypo) Manic Symptoms:  none Anxiety  Symptoms:  Excessive Worry, Psychotic Symptoms:  none PTSD Symptoms: NA   Past Psychiatric History: Previous inpatient admission, reports a diagnosis of major depressive disorder and insomnia. Reports taking Zoloft, Prozac and Seroquel in the past however denies that he has been medication adherent.   Substance Abuse History in the last 12 months:  Yes.   Consequences of Substance Abuse: Withdrawal Symptoms:   skin crawaling  Previous Psychotropic Medications: Yes  Principal Problem: Severe recurrent major depression without psychotic features [facility]) Discharge Diagnoses: Patient Active Problem List   Diagnosis Date Noted  . 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 Severe recurrent major depression without psychotic features (HCC) [F33.2] 03/03/2018  . Substance induced mood disorder (HCC) [F19.94]   . Cocaine use disorder, severe, dependence (HCC) [F14.20]   . 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 Polysubstance dependence (HCC) [F19.20]   . MDD (major depressive disorder), severe (HCC) [F32.2] 01/03/2018  . Acute pain of left shoulder [M25.512]   . MDD (major depressive disorder), recurrent severe, without psychosis (HCC) [F33.2] 03/10/2017    Past Psychiatric History:   Past Medical History:  Past Medical History:  Diagnosis Date  . Suicidal ideation    History reviewed. No pertinent surgical history. Family History: History reviewed. No pertinent family history. Family Psychiatric  History:  Social History:  Social History   Substance and Sexual Activity  Alcohol Use No  . Frequency: Never     Social History   Substance and Sexual Activity  Drug Use Yes  . Types: Marijuana, Methamphetamines, Heroin    Social History  Socioeconomic History  . Marital status: Single    Spouse name: Not on file  . Number of children: Not on file  . Years of education: Not on file   . Highest education level: Not on file  Occupational History  . Not on file  Social Needs  . Financial resource strain: Not on file  . Food insecurity:    Worry: Not on file    Inability: Not on file  . Transportation needs:    Medical: Not on file    Non-medical: Not on file  Tobacco Use  . Smoking status: Heavy Tobacco Smoker    Packs/day: 1.50    Types: Cigarettes  . Smokeless tobacco: Never Used  Substance and Sexual Activity  . Alcohol use: No    Frequency: Never  . Drug use: Yes    Types: Marijuana, Methamphetamines, Heroin  . Sexual activity: Yes    Birth control/protection: None  Lifestyle  . Physical activity:    Days per week: Not on file    Minutes per session: Not on file  . Stress: Not on file  Relationships  . Social connections:    Talks on phone: Not on file    Gets together: Not on file    Attends religious service: Not on file    Active member of club or organization: Not on file    Attends meetings of clubs or organizations: Not on file    Relationship status: Not on file  Other Topics Concern  . Not on file  Social History Narrative  . Not on file    Hospital Course:  DWYANE DUPREE was admitted for Severe recurrent major depression without psychotic features [facility]) with psychosis and crisis management.  Pt was treated discharged with the medications listed below under Medication List.  Medical problems were identified and treated as needed.  Home medications were restarted as appropriate.  Improvement was monitored by observation and [name] 's daily report of symptom reduction.  Emotional and mental status was monitored by daily self-inventory reports completed by [name] and clinical staff.         COMMODORE BELLEW was evaluated by the treatment team for stability and plans for continued recovery upon discharge. MISAEL MCGAHA 's motivation was an integral factor for scheduling further treatment. Employment, transportation, bed  availability, health status, family support, and any pending legal issues were also considered during hospital stay. Pt was offered further treatment options upon discharge including but not limited to Residential, Intensive Outpatient, and Outpatient treatment.  SIDDHANT HASHEMI will follow up with the services as listed below under Follow Up Information.     Upon completion of this admission the patient was both mentally and medically stable for discharge denying suicidal homicidal ideation, auditory or visual hallucination. deny delusional thoughts and paranoia.    [name] responded well to treatment with Zoloft 50mg  po daily,  Risperdal 1mg   QHS, Neurontin 300 mg  PO TID and vistaril 25 mg without adverse effects. Initially, pt did complain of nausea with these medications, but this resolved. Pt demonstrated improvement without reported or observed adverse effects to the point of stability appropriate for outpatient management. Pertinent labs include:  for which outpatient follow-up is necessary for lab recheck as mentioned below. Reviewed CBC, CMP, BAL, and UDS; all unremarkable aside from noted exceptions. UDS positive BZD and opiates.   Physical Findings: AIMS: Facial and Oral Movements Muscles of Facial Expression: None, normal Lips and  Perioral Area: None, normal Jaw: None, normal Tongue: None, normal,Extremity Movements Upper (arms, wrists, hands, fingers): None, normal Lower (legs, knees, ankles, toes): None, normal, Trunk Movements Neck, shoulders, hips: None, normal, Overall Severity Severity of abnormal movements (highest score from questions above): None, normal Incapacitation due to abnormal movements: None, normal Patient's awareness of abnormal movements (rate only patient's report): No Awareness, Dental Status Current problems with teeth and/or dentures?: No Does patient usually wear dentures?: No  CIWA:    COWS:  COWS Total Score: 2  Musculoskeletal: Strength & Muscle  Tone: within normal limits Gait & Station: normal Patient leans: N/A  Psychiatric Specialty Exam: See SRA By MD  Physical Exam   ROS   Blood pressure (!) 143/109, pulse (!) 101, temperature 98.4 F (36.9 C), temperature source Oral, resp. rate 16, height 6\' 2"  (1.88 m), weight 86.2 kg.Body mass index is 24.39 kg/m.  Have you used any form of tobacco in the last 30 days? (Cigarettes, Smokeless Tobacco, Cigars, and/or Pipes): Yes  Has this patient used any form of tobacco in the last 30 days? (Cigarettes, Smokeless Tobacco, Cigars, and/or Pipes) Yes, Yes, A prescription for an FDA-approved tobacco cessation medication was offered at discharge and the patient refused  Blood Alcohol level:  Lab Results  Component Value Date   ETH <10 01/17/2018    Metabolic Disorder Labs:  Lab Results  Component Value Date   HGBA1C 5.4 03/05/2018   MPG 108.28 03/05/2018   No results found for: PROLACTIN Lab Results  Component Value Date   CHOL 144 03/05/2018   TRIG 111 03/05/2018   HDL 38 (L) 03/05/2018   CHOLHDL 3.8 03/05/2018   VLDL 22 03/05/2018   LDLCALC 84 03/05/2018    See Psychiatric Specialty Exam and Suicide Risk Assessment completed by Attending Physician prior to discharge.  Discharge destination:  Home  Is patient on multiple antipsychotic therapies at discharge:  No   Has Patient had three or more failed trials of antipsychotic monotherapy by history:  No  Recommended Plan for Multiple Antipsychotic Therapies: NA  Discharge Instructions    Discharge instructions   Complete by:  As directed    Please continue to take medications as directed. If your symptoms return, worsen, or persist please call your 911, report to local ER, or contact crisis hotline. Please do not drink alcohol or use any illegal substances while taking prescription medications.     Allergies as of 03/07/2018   No Known Allergies     Medication List    STOP taking these medications   buPROPion 150  MG 24 hr tablet Commonly known as:  WELLBUTRIN XL   lisinopril 10 MG tablet Commonly known as:  PRINIVIL,ZESTRIL   propranolol 20 MG tablet Commonly known as:  INDERAL   QUEtiapine 100 MG tablet Commonly known as:  SEROQUEL   QUEtiapine 50 MG tablet Commonly known as:  SEROQUEL   ZUBSOLV 5.7-1.4 MG Subl Generic drug:  Buprenorphine HCl-Naloxone HCl     TAKE these medications     Indication  amLODipine 5 MG tablet Commonly known as:  NORVASC Take 1 tablet (5 mg total) by mouth daily.  Indication:  High Blood Pressure Disorder   cloNIDine 0.2 MG tablet Commonly known as:  CATAPRES Take 1 tablet (0.2 mg total) by mouth 4 (four) times daily as needed (For addiction or blood pressure.).  Indication:  High Blood Pressure Disorder, Opiate Withdrawal   gabapentin 300 MG capsule Commonly known as:  NEURONTIN Take 1 capsule (300  mg total) by mouth 3 (three) times daily. What changed:  how much to take  Indication:  anxiety   hydrOXYzine 25 MG tablet Commonly known as:  ATARAX/VISTARIL Take 1 tablet (25 mg total) by mouth every 6 (six) hours as needed for anxiety. What changed:    medication strength  how much to take  Indication:  Feeling Anxious   nicotine 21 mg/24hr patch Commonly known as:  NICODERM CQ - dosed in mg/24 hours Place 1 patch (21 mg total) onto the skin daily.  Indication:  Nicotine Addiction   risperiDONE 1 MG tablet Commonly known as:  RISPERDAL Take 1 tablet (1 mg total) by mouth at bedtime. What changed:    medication strength  how much to take  Another medication with the same name was removed. Continue taking this medication, and follow the directions you see here.  Indication:  Manic-Depression, Major Depressive Disorder   sertraline 50 MG tablet Commonly known as:  ZOLOFT Take 1 tablet (50 mg total) by mouth daily. Start taking on:  03/08/2018 What changed:    medication strength  how much to take  Indication:  Major Depressive  Disorder   traZODone 50 MG tablet Commonly known as:  DESYREL Take 1 tablet (50 mg total) by mouth at bedtime as needed for sleep.  Indication:  Anxiety Disorder, Trouble Sleeping      Follow-up Information    [facility] Follow up.   Specialty:  Addiction Medicine Why:  Accepted for admission on 03/07/18.  Contact information: [address] [place] [address] [phone]        [facility] Follow up.   Specialty:  Behavioral Health Why:  Walk-in hours are Monday-Friday from 8:00am-5:00pm. Please be sure to bring your Photo ID, SSN, insurance information, and any discharge paperwork from this hospitalization.  [address] [place] [address] [phone]           Follow-up recommendations:  Activity:  as tolerated Diet:  heart healthy  Comments:  Take all medications as prescribed. Keep all follow-up appointments as scheduled.  Do not consume alcohol or use illegal drugs while on prescription medications. Report any adverse effects from your medications to your primary care provider promptly.  In the event of recurrent symptoms or worsening symptoms, call 911, a crisis hotline, or go to the nearest emergency department for evaluation.   Signed: Truman Hayward, FNP 03/07/2018, 12:43 PM  Patient seen, Suicide Assessment Completed.  Disposition Plan Reviewed

## 2018-03-07 NOTE — Progress Notes (Signed)
Adult Psychoeducational Group Note  Date:  03/07/2018 Time:  9:39 AM  Group Topic/Focus:  Orientation:   The focus of this group is to educate the patient on the purpose and policies of crisis stabilization and provide a format to answer questions about their admission.  The group details unit policies and expectations of patients while admitted.  Participation Level:  Active  Participation Quality:  Appropriate  Affect:  Appropriate  Cognitive:  Alert  Insight: Appropriate  Engagement in Group:  Engaged  Modes of Intervention:  Discussion and Education  Additional Comments:     Karren Cobble 03/07/2018, 9:39 AM

## 2018-04-06 DIAGNOSIS — M542 Cervicalgia: Secondary | ICD-10-CM | POA: Diagnosis not present

## 2018-04-06 DIAGNOSIS — M549 Dorsalgia, unspecified: Secondary | ICD-10-CM | POA: Diagnosis not present

## 2018-04-06 DIAGNOSIS — S149XXA Injury of unspecified nerves of neck, initial encounter: Secondary | ICD-10-CM | POA: Diagnosis not present

## 2018-04-09 DIAGNOSIS — R202 Paresthesia of skin: Secondary | ICD-10-CM | POA: Diagnosis not present

## 2018-04-09 DIAGNOSIS — F419 Anxiety disorder, unspecified: Secondary | ICD-10-CM | POA: Diagnosis not present

## 2018-04-09 DIAGNOSIS — M545 Low back pain: Secondary | ICD-10-CM | POA: Diagnosis not present

## 2018-04-09 DIAGNOSIS — R51 Headache: Secondary | ICD-10-CM | POA: Diagnosis not present

## 2018-04-09 DIAGNOSIS — F329 Major depressive disorder, single episode, unspecified: Secondary | ICD-10-CM | POA: Diagnosis not present

## 2018-04-14 DIAGNOSIS — R0602 Shortness of breath: Secondary | ICD-10-CM | POA: Diagnosis not present

## 2018-04-14 DIAGNOSIS — R112 Nausea with vomiting, unspecified: Secondary | ICD-10-CM | POA: Diagnosis not present

## 2018-04-14 DIAGNOSIS — R079 Chest pain, unspecified: Secondary | ICD-10-CM | POA: Diagnosis not present

## 2018-04-14 DIAGNOSIS — F17200 Nicotine dependence, unspecified, uncomplicated: Secondary | ICD-10-CM | POA: Diagnosis not present

## 2018-04-14 DIAGNOSIS — I10 Essential (primary) hypertension: Secondary | ICD-10-CM | POA: Diagnosis not present

## 2018-04-14 DIAGNOSIS — R072 Precordial pain: Secondary | ICD-10-CM | POA: Diagnosis not present

## 2018-04-18 DIAGNOSIS — F988 Other specified behavioral and emotional disorders with onset usually occurring in childhood and adolescence: Secondary | ICD-10-CM | POA: Diagnosis not present

## 2018-04-18 DIAGNOSIS — F419 Anxiety disorder, unspecified: Secondary | ICD-10-CM | POA: Diagnosis not present

## 2018-04-18 DIAGNOSIS — F329 Major depressive disorder, single episode, unspecified: Secondary | ICD-10-CM | POA: Diagnosis not present

## 2018-04-18 DIAGNOSIS — Z5181 Encounter for therapeutic drug level monitoring: Secondary | ICD-10-CM | POA: Diagnosis not present

## 2018-04-18 DIAGNOSIS — Z79899 Other long term (current) drug therapy: Secondary | ICD-10-CM | POA: Diagnosis not present

## 2018-04-18 DIAGNOSIS — F1120 Opioid dependence, uncomplicated: Secondary | ICD-10-CM | POA: Diagnosis not present

## 2018-04-29 DIAGNOSIS — F1120 Opioid dependence, uncomplicated: Secondary | ICD-10-CM | POA: Diagnosis not present

## 2018-04-29 DIAGNOSIS — Z5181 Encounter for therapeutic drug level monitoring: Secondary | ICD-10-CM | POA: Diagnosis not present

## 2018-04-29 DIAGNOSIS — F329 Major depressive disorder, single episode, unspecified: Secondary | ICD-10-CM | POA: Diagnosis not present

## 2018-04-29 DIAGNOSIS — Z79899 Other long term (current) drug therapy: Secondary | ICD-10-CM | POA: Diagnosis not present

## 2018-04-29 DIAGNOSIS — F988 Other specified behavioral and emotional disorders with onset usually occurring in childhood and adolescence: Secondary | ICD-10-CM | POA: Diagnosis not present

## 2018-04-29 DIAGNOSIS — F419 Anxiety disorder, unspecified: Secondary | ICD-10-CM | POA: Diagnosis not present

## 2018-05-01 DIAGNOSIS — F988 Other specified behavioral and emotional disorders with onset usually occurring in childhood and adolescence: Secondary | ICD-10-CM | POA: Diagnosis not present

## 2018-05-01 DIAGNOSIS — Z79899 Other long term (current) drug therapy: Secondary | ICD-10-CM | POA: Diagnosis not present

## 2018-05-01 DIAGNOSIS — F1120 Opioid dependence, uncomplicated: Secondary | ICD-10-CM | POA: Diagnosis not present

## 2018-05-01 DIAGNOSIS — Z5181 Encounter for therapeutic drug level monitoring: Secondary | ICD-10-CM | POA: Diagnosis not present

## 2018-05-01 DIAGNOSIS — F419 Anxiety disorder, unspecified: Secondary | ICD-10-CM | POA: Diagnosis not present

## 2018-05-01 DIAGNOSIS — F329 Major depressive disorder, single episode, unspecified: Secondary | ICD-10-CM | POA: Diagnosis not present
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
## 2019-03-08 ENCOUNTER — Inpatient Hospital Stay
Admit: 2019-03-08 | Discharge: 2019-03-09 | Disposition: A | Discharge status: Other Acute Inpatient Hospital | Payer: MEDICARE | Attending: Emergency Medicine

## 2019-03-08 DIAGNOSIS — F329 Major depressive disorder, single episode, unspecified: Secondary | ICD-10-CM

## 2019-03-08 LAB — METABOLIC PANEL, COMPREHENSIVE
A-G Ratio: 1.7 (ref 1.0–3.1)
ALT (SGPT): 23 U/L (ref 12–78)
AST (SGOT): 15 U/L (ref 15–37)
Albumin: 4.9 g/dL (ref 3.4–5.0)
Alk. phosphatase: 149 U/L — ABNORMAL HIGH (ref 46–116)
Anion gap: 12 mmol/L (ref 10–17)
BUN/Creatinine ratio: 15 (ref 6–20)
BUN: 14 MG/DL (ref 7–18)
Bilirubin, total: 0.9 MG/DL (ref 0.2–1.0)
CO2: 35 mmol/L — ABNORMAL HIGH (ref 21–32)
Calcium: 9.8 MG/DL (ref 8.5–10.1)
Chloride: 99 mmol/L (ref 98–107)
Creatinine: 0.93 MG/DL (ref 0.6–1.3)
GFR est AA: >60 mL/min/{1.73_m2} (ref >60)
GFR est non-AA: >60 mL/min/{1.73_m2} (ref >60)
Globulin: 2.9 g/dL
Glucose: 111 mg/dL — ABNORMAL HIGH (ref 74–106)
Potassium: 4.1 mmol/L (ref 3.5–5.1)
Protein, total: 7.8 g/dL (ref 6.4–8.2)
Sodium: 142 mmol/L (ref 136–145)

## 2019-03-08 LAB — CBC WITH AUTOMATED DIFF
ABS. BASOPHILS: 0 10*3/uL (ref 0.0–0.2)
ABS. EOSINOPHILS: 0 10*3/uL (ref 0.0–0.7)
ABS. LYMPHOCYTES: 1.1 10*3/uL — ABNORMAL LOW (ref 1.2–3.4)
ABS. MONOCYTES: 0.5 10*3/uL — ABNORMAL LOW (ref 1.1–3.2)
ABS. NEUTROPHILS: 5.9 10*3/uL (ref 1.4–6.5)
BASOPHILS: 0 % (ref 0–2)
EOSINOPHILS: 0 % (ref 0–5)
HCT: 47.1 % — ABNORMAL HIGH (ref 36.8–45.2)
HGB: 16.2 g/dL — ABNORMAL HIGH (ref 12.8–15.0)
IMMATURE GRANULOCYTES: 0 % (ref 0.0–5.0)
LYMPHOCYTES: 14 % — ABNORMAL LOW (ref 16–40)
MCH: 31.2 PG — ABNORMAL HIGH (ref 27–31)
MCHC: 34.4 g/dL (ref 32–36)
MCV: 90.8 FL (ref 81–99)
MONOCYTES: 6 % (ref 0–12)
MPV: 8.5 FL (ref 7.4–10.4)
NEUTROPHILS: 80 % — ABNORMAL HIGH (ref 40–70)
PLATELET: 252 10*3/uL (ref 140–450)
RBC: 5.19 M/uL (ref 4.0–5.2)
RDW: 12.8 % (ref 11.5–14.5)
WBC: 7.5 10*3/uL (ref 4.8–10.8)

## 2019-03-08 LAB — DRUG SCREEN, URINE
AMPHETAMINES: NEGATIVE
Amphetamine Screen, Urine: NEGATIVE
BARBITURATES: NEGATIVE
BENZODIAZEPINES: POSITIVE — AB
Barbiturate Screen, Urine: NEGATIVE
Benzodiazepine Screen, Urine: POSITIVE — AB
Buprenorphine Screen, Urine: NEGATIVE
Buprenorphine screen, urine: NEGATIVE
COCAINE: POSITIVE — AB
Cocaine Screen Urine: POSITIVE — AB
METHADONE: NEGATIVE
Methadone Screen, Urine: NEGATIVE
Methamphetamines: NEGATIVE
Methamphetamines: NEGATIVE
OPIATES: NEGATIVE
OXYCODONE SCREEN: NEGATIVE
Opiate Screen, Urine: NEGATIVE
Oxycodone Screen: NEGATIVE
PCP Screen, Urine: NEGATIVE
PCP(PHENCYCLIDINE): NEGATIVE
PROPOXYPHENE,PPX: NEGATIVE
PROPOXYPHENE: NEGATIVE
THC (TH-CANNABINOL): NEGATIVE
THC Screen, Urine: NEGATIVE
TRICYCLICS,TCAT: NEGATIVE
TRICYCLICS: NEGATIVE

## 2019-03-08 LAB — ETHYL ALCOHOL
ALCOHOL(ETHYL),SERUM: <3 MG/DL (ref <3.0)
Ethyl Alcohol: <3 MG/DL (ref <3.0)

## 2019-03-08 LAB — COMPREHENSIVE METABOLIC PANEL
ALT: 23 U/L (ref 12–78)
AST: 15 U/L (ref 15–37)
Albumin/Globulin Ratio: 1.7 (ref 1.0–3.1)
Albumin: 4.9 g/dL (ref 3.4–5.0)
Alkaline Phosphatase: 149 U/L — ABNORMAL HIGH (ref 46–116)
Anion Gap: 12 mmol/L (ref 10–17)
BUN: 14 MG/DL (ref 7–18)
Bun/Cre Ratio: 15 (ref 6–20)
CO2: 35 mmol/L — ABNORMAL HIGH (ref 21–32)
Calcium: 9.8 MG/DL (ref 8.5–10.1)
Chloride: 99 mmol/L (ref 98–107)
Creatinine: 0.93 MG/DL (ref 0.6–1.3)
EGFR IF NonAfrican American: >60 mL/min/{1.73_m2} (ref >60)
GFR African American: >60 mL/min/{1.73_m2} (ref >60)
Globulin: 2.9 g/dL
Glucose: 111 mg/dL — ABNORMAL HIGH (ref 74–106)
Potassium: 4.1 mmol/L (ref 3.5–5.1)
Sodium: 142 mmol/L (ref 136–145)
Total Bilirubin: 0.9 MG/DL (ref 0.2–1.0)
Total Protein: 7.8 g/dL (ref 6.4–8.2)

## 2019-03-08 LAB — CBC WITH AUTO DIFFERENTIAL
Basophils %: 0 % (ref 0–2)
Basophils Absolute: 0 10*3/uL (ref 0.0–0.2)
Eosinophils %: 0 % (ref 0–5)
Eosinophils Absolute: 0 10*3/uL (ref 0.0–0.7)
Hematocrit: 47.1 % — ABNORMAL HIGH (ref 36.8–45.2)
Hemoglobin: 16.2 g/dL — ABNORMAL HIGH (ref 12.8–15.0)
Immature Granulocytes: 0 % (ref 0.0–5.0)
Lymphocytes %: 14 % — ABNORMAL LOW (ref 16–40)
Lymphocytes Absolute: 1.1 10*3/uL — ABNORMAL LOW (ref 1.2–3.4)
MCH: 31.2 PG — ABNORMAL HIGH (ref 27–31)
MCHC: 34.4 g/dL (ref 32–36)
MCV: 90.8 FL (ref 81–99)
MPV: 8.5 FL (ref 7.4–10.4)
Monocytes %: 6 % (ref 0–12)
Monocytes Absolute: 0.5 10*3/uL — ABNORMAL LOW (ref 1.1–3.2)
Neutrophils %: 80 % — ABNORMAL HIGH (ref 40–70)
Neutrophils Absolute: 5.9 10*3/uL (ref 1.4–6.5)
Platelets: 252 10*3/uL (ref 140–450)
RBC: 5.19 M/uL (ref 4.0–5.2)
RDW: 12.8 % (ref 11.5–14.5)
WBC: 7.5 10*3/uL (ref 4.8–10.8)

## 2019-03-08 NOTE — ED Notes (Signed)
Patient walked in to triage room with c/o Suicidal thoughts and hearing voices asking to die. Patient calm and cooperative, ED tech Liborio Nixon and security at bedside initiating safety protocols

## 2019-03-08 NOTE — ED Provider Notes (Signed)
Patient is a 48 year old male with a significant past medical history for psychiatric disorders presenting to the emergency department for suicidal ideation.  The patient states that he has been feeling like this for the past few days.  He has a plan to cut his wrists.  The patient is on multiple psychiatric medications, but states that he has not taken any of them for the past several days because "he has been getting too high to do anything".  He admits to using crack and heroin.  He states that he used to do this all the time, but over the past few days has had a relapse.  The patient denies any alcohol use.  He denies any homicidal ideations or hallucinations.  He denies any other associated symptoms or modifying factors.           No past medical history on file.    No past surgical history on file.      No family history on file.    Social History     Socioeconomic History   ??? Marital status: DIVORCED     Spouse name: Not on file   ??? Number of children: Not on file   ??? Years of education: Not on file   ??? Highest education level: Not on file   Occupational History   ??? Not on file   Social Needs   ??? Financial resource strain: Not on file   ??? Food insecurity     Worry: Not on file     Inability: Not on file   ??? Transportation needs     Medical: Not on file     Non-medical: Not on file   Tobacco Use   ??? Smoking status: Not on file   Substance and Sexual Activity   ??? Alcohol use: Not on file   ??? Drug use: Not on file   ??? Sexual activity: Not on file   Lifestyle   ??? Physical activity     Days per week: Not on file     Minutes per session: Not on file   ??? Stress: Not on file   Relationships   ??? Social Product manager on phone: Not on file     Gets together: Not on file     Attends religious service: Not on file     Active member of club or organization: Not on file     Attends meetings of clubs or organizations: Not on file     Relationship status: Not on file   ??? Intimate partner violence     Fear of current  or ex partner: Not on file     Emotionally abused: Not on file     Physically abused: Not on file     Forced sexual activity: Not on file   Other Topics Concern   ??? Not on file   Social History Narrative   ??? Not on file         ALLERGIES: Trazodone    Review of Systems   Constitutional: Negative.  Negative for chills and fever.   HENT: Negative.  Negative for congestion and sore throat.    Respiratory: Negative.  Negative for cough and shortness of breath.    Cardiovascular: Negative.  Negative for chest pain and palpitations.   Gastrointestinal: Negative.  Negative for abdominal pain, diarrhea, nausea and vomiting.   Genitourinary: Negative.  Negative for dysuria and frequency.   Musculoskeletal: Negative.  Negative for arthralgias  and myalgias.   Skin: Negative.  Negative for pallor and rash.   Allergic/Immunologic: Negative.  Negative for immunocompromised state.   Neurological: Negative.  Negative for weakness, light-headedness and headaches.   Psychiatric/Behavioral: Positive for dysphoric mood and suicidal ideas.   All other systems reviewed and are negative.      Vitals:    03/08/19 1848   BP: (!) 145/107   Resp: 18   Temp: 98.1 ??F (36.7 ??C)   SpO2: 98%   Weight: 95.3 kg (210 lb)   Height: 6\' 2"  (1.88 m)            Physical Exam  Vitals signs and nursing note reviewed.   Constitutional:       Appearance: He is well-developed.   HENT:      Head: Normocephalic and atraumatic.   Eyes:      General: No scleral icterus.        Right eye: No discharge.         Left eye: No discharge.      Conjunctiva/sclera: Conjunctivae normal.   Neck:      Musculoskeletal: Normal range of motion and neck supple.   Cardiovascular:      Rate and Rhythm: Normal rate and regular rhythm.      Heart sounds: Normal heart sounds. No murmur. No friction rub. No gallop.    Pulmonary:      Effort: Pulmonary effort is normal. No respiratory distress.      Breath sounds: Normal breath sounds. No wheezing or rales.   Abdominal:      General:  Bowel sounds are normal. There is no distension.      Palpations: Abdomen is soft.      Tenderness: There is no abdominal tenderness. There is no guarding or rebound.   Musculoskeletal: Normal range of motion.   Skin:     General: Skin is warm and dry.      Findings: No erythema or rash.   Neurological:      Mental Status: He is alert and oriented to person, place, and time.   Psychiatric:         Behavior: Behavior normal.         Thought Content: Thought content normal.         Judgment: Judgment normal.      Comments: Patient admits to suicidal ideation with a plan to cut his wrist.  Denies homicidal ideation or hallucinations.          MDM  Number of Diagnoses or Management Options  Diagnosis management comments: Differential includes psychiatric disorder, substance abuse, electrolyte abnormalities, infection, dehydration    1.  Psychiatric disorder  Patient has UDS significant for cocaine and benzodiazepines.  Otherwise, patient has normal blood work.  I have medically cleared the patient for social work and psychiatry consult.  The patient was signed out to the oncoming provider for final disposition.    Evaluation and treatment of this patient was during the time of the national and state emergency arising from the COVID-19 coronavirus pandemic.  The hospitalist functioning under the conditions of the COVID team coronavirus public health crisis, and both the state of [place] and the federal government have declared states of the emergency.  Treatments and procedures performed to meet the current and available best practice and guidance for patients during the COVID-19 pandemic         Amount and/or Complexity of Data Reviewed  Clinical lab tests: reviewed  ED Course as of Mar 07 2109   Wynelle Link Mar 08, 2019   1945 COCAINE(!): Positive [AW]   1945 BENZODIAZEPINES(!): Positive [AW]      ED Course User Index  [AW] Nena Alexander, DO       Procedures

## 2019-03-08 NOTE — ED Notes (Signed)
Bedside shift change report given to RN Dorian Furnace (oncoming nurse) by Olive Bass (offgoing nurse). Report included the following information SBAR, Kardex, ED Summary, [facility] and Recent Results.

## 2019-03-08 NOTE — Progress Notes (Signed)
This Pt walked to the Ed stating that he is feeling SI with a plan to cut his wrist The Pt is reporting that about 20 years ago he cut his wrist . The marks are not visible on this Pt's wrist  The Pt is reporting that he was in-pt at a hospital in [place] about 8 months ago for psych  The Pt is reporting that he has a Therapist, sports in [place] that he last saw about a month ago  And his next appointment is in 2 months  The Pt is on psych medication ,but has not taken his psych meds  for a few days . The Pt's tox screen is positive for cocaine and benzo.  The Pt is alert and oriented times three The Pt is reporting that he feels very depressed and has not energy or interest is doing anything  The Pt is is denying HI and hallucinations  The Pt reports eating okay ,but not sleeping well.  The Pt is cooperative . The Pt appears not to have bath recently.  The Pt is reporting that he is feeling down and going through addiction withdrawn.  The Pt's thought process is logical  and his speech is unremarkable. Other information about the Pt:Pt was born in [place] and is the oldest of 8 children raised by theirr parent /The Pt has a 12 grade education and no legal issue and no military history.     Axis 1 F 33.9 Major Depressive Disorder/F 14.10 Cocaine Abuse/F 41.9 Anxiety Disorder, Unspecified   Axis 11 Deferred  Axis 111 HTN   Axis 1 V Use of illegal drug /Non-compliant with psych medication  Axis  V 35    This Pt was discussed with Dr Marcelle Overlie  At 10:47 pm and the decision is to admit the Pt . The Psych unit at this hospital is filled ,and [facility]  AT 10:52 [facility] told this SW to send the referral for review -they have beds

## 2019-03-08 NOTE — ED Provider Notes (Signed)
Patient is a 48 year old male with a significant past medical history for psychiatric disorders presenting to the emergency department for suicidal ideation.  The patient states that he has been feeling like this for the past few days.  He has a plan to cut his wrists.  The patient is on multiple psychiatric medications, but states that he has not taken any of them for the past several days because "he has been getting too high to do anything".  He admits to using crack and heroin.  He states that he used to do this all the time, but over the past few days has had a relapse.  The patient denies any alcohol use.  He denies any homicidal ideations or hallucinations.  He denies any other associated symptoms or modifying factors.           No past medical history on file.    No past surgical history on file.      No family history on file.    Social History     Socioeconomic History   ??? Marital status: DIVORCED     Spouse name: Not on file   ??? Number of children: Not on file   ??? Years of education: Not on file   ??? Highest education level: Not on file   Occupational History   ??? Not on file   Social Needs   ??? Financial resource strain: Not on file   ??? Food insecurity     Worry: Not on file     Inability: Not on file   ??? Transportation needs     Medical: Not on file     Non-medical: Not on file   Tobacco Use   ??? Smoking status: Not on file   Substance and Sexual Activity   ??? Alcohol use: Not on file   ??? Drug use: Not on file   ??? Sexual activity: Not on file   Lifestyle   ??? Physical activity     Days per week: Not on file     Minutes per session: Not on file   ??? Stress: Not on file   Relationships   ??? Social Wellsite geologistconnections     Talks on phone: Not on file     Gets together: Not on file     Attends religious service: Not on file     Active member of club or organization: Not on file     Attends meetings of clubs or organizations: Not on file     Relationship status: Not on file   ??? Intimate partner violence      Fear of current or ex partner: Not on file     Emotionally abused: Not on file     Physically abused: Not on file     Forced sexual activity: Not on file   Other Topics Concern   ??? Not on file   Social History Narrative   ??? Not on file         ALLERGIES: Trazodone    Review of Systems   Constitutional: Negative.  Negative for chills and fever.   HENT: Negative.  Negative for congestion and sore throat.    Respiratory: Negative.  Negative for cough and shortness of breath.    Cardiovascular: Negative.  Negative for chest pain and palpitations.   Gastrointestinal: Negative.  Negative for abdominal pain, diarrhea, nausea and vomiting.   Genitourinary: Negative.  Negative for dysuria and frequency.   Musculoskeletal: Negative.  Negative for arthralgias  and myalgias.   Skin: Negative.  Negative for pallor and rash.   Allergic/Immunologic: Negative.  Negative for immunocompromised state.   Neurological: Negative.  Negative for weakness, light-headedness and headaches.   Psychiatric/Behavioral: Positive for dysphoric mood and suicidal ideas.   All other systems reviewed and are negative.      Vitals:    03/08/19 1848   BP: (!) 145/107   Resp: 18   Temp: 98.1 ??F (36.7 ??C)   SpO2: 98%   Weight: 95.3 kg (210 lb)   Height: 6\' 2"  (1.88 m)            Physical Exam  Vitals signs and nursing note reviewed.   Constitutional:       Appearance: He is well-developed.   HENT:      Head: Normocephalic and atraumatic.   Eyes:      General: No scleral icterus.        Right eye: No discharge.         Left eye: No discharge.      Conjunctiva/sclera: Conjunctivae normal.   Neck:      Musculoskeletal: Normal range of motion and neck supple.   Cardiovascular:      Rate and Rhythm: Normal rate and regular rhythm.      Heart sounds: Normal heart sounds. No murmur. No friction rub. No gallop.    Pulmonary:      Effort: Pulmonary effort is normal. No respiratory distress.      Breath sounds: Normal breath sounds. No wheezing or rales.    Abdominal:      General: Bowel sounds are normal. There is no distension.      Palpations: Abdomen is soft.      Tenderness: There is no abdominal tenderness. There is no guarding or rebound.   Musculoskeletal: Normal range of motion.   Skin:     General: Skin is warm and dry.      Findings: No erythema or rash.   Neurological:      Mental Status: He is alert and oriented to person, place, and time.   Psychiatric:         Behavior: Behavior normal.         Thought Content: Thought content normal.         Judgment: Judgment normal.      Comments: Patient admits to suicidal ideation with a plan to cut his wrist.  Denies homicidal ideation or hallucinations.          MDM  Number of Diagnoses or Management Options  Diagnosis management comments: Differential includes psychiatric disorder, substance abuse, electrolyte abnormalities, infection, dehydration    1.  Psychiatric disorder  Patient has UDS significant for cocaine and benzodiazepines.  Otherwise, patient has normal blood work.  I have medically cleared the patient for social work and psychiatry consult.  The patient was signed out to the oncoming provider for final disposition.    Evaluation and treatment of this patient was during the time of the national and state emergency arising from the COVID-19 coronavirus pandemic.  The hospitalist functioning under the conditions of the COVID team coronavirus public health crisis, and both the state of [place] and the federal government have declared states of the emergency.  Treatments and procedures performed to meet the current and available best practice and guidance for patients during the COVID-19 pandemic         Amount and/or Complexity of Data Reviewed  Clinical lab tests: reviewed  ED Course as of Mar 07 2109   Wynelle Link Mar 08, 2019   1945 COCAINE(!): Positive [AW]   1945 BENZODIAZEPINES(!): Positive [AW]      ED Course User Index  [AW] Nena Alexander, DO       Procedures

## 2019-03-08 NOTE — Progress Notes (Signed)
This Pt walked to the Ed stating that he is feeling SI with a plan to cut his wrist The Pt is reporting that about 20 years ago he cut his wrist . The marks are not visible on this Pt's wrist  The Pt is reporting that he was in-pt at a hospital in [place] about 8 months ago for psych  The Pt is reporting that he has a Psychiatrist in [place] that he last saw about a month ago  And his next appointment is in 2 months  The Pt is on psych medication ,but has not taken his psych meds  for a few days . The Pt's tox screen is positive for cocaine and benzo.  The Pt is alert and oriented times three The Pt is reporting that he feels very depressed and has not energy or interest is doing anything  The Pt is is denying HI and hallucinations  The Pt reports eating okay ,but not sleeping well.  The Pt is cooperative . The Pt appears not to have bath recently.  The Pt is reporting that he is feeling down and going through addiction withdrawn.  The Pt's thought process is logical  and his speech is unremarkable. Other information about the Pt:Pt was born in [place] and is the oldest of 8 children raised by theirr parent /The Pt has a 12 grade education and no legal issue and no military history.     Axis 1 F 33.9 Major Depressive Disorder/F 14.10 Cocaine Abuse/F 41.9 Anxiety Disorder, Unspecified   Axis 11 Deferred  Axis 111 HTN   Axis 1 V Use of illegal drug /Non-compliant with psych medication  Axis  V 35    This Pt was discussed with Dr Koola  At 10:47 pm and the decision is to admit the Pt . The Psych unit at this hospital is filled ,and [facility]  AT 10:52 [facility] told [facility] to send the referral for review -they have beds

## 2019-03-08 NOTE — ED Notes (Signed)
Bedside shift change report given to RN Susan Hatef (oncoming nurse) by Abey (offgoing nurse). Report included the following information SBAR, Kardex, ED Summary, MAR and Recent Results.

## 2019-03-08 NOTE — ED Triage Notes (Signed)
Patient walked in to triage room with c/o Suicidal thoughts and hearing voices asking to die. Patient calm and cooperative, ED tech Janice and security at bedside initiating safety protocols

## 2019-03-09 LAB — SARS-COV-2
SARS-CoV-2 by PCR: NEGATIVE
SARS-CoV-2: NEGATIVE

## 2019-03-09 MED ORDER — LORAZEPAM 2 MG/ML IJ SOLN
2 mg/mL | Freq: Once | INTRAMUSCULAR | Status: AC
Start: 2019-03-09 — End: 2019-03-09
  Administered 2019-03-09: 17:00:00 via INTRAVENOUS

## 2019-03-09 MED FILL — LORAZEPAM 2 MG/ML IJ SOLN: 2 mg/mL | INTRAMUSCULAR | Qty: 1

## 2019-03-09 NOTE — Progress Notes (Signed)
Patient is a 48 year old divorced disabled male who presented to the ED as  A self referral with complaint of feeling suicidal with a plan to cut his wrist.  He was evaluated by previous SW and was recommended for admission, there were no male beds at [facility], chart was faxed to [facility], spoke with Mardene Celeste who stated that patient had been accepted and authorization could be obtained.  Patient has Medicare, authorization is not required.

## 2019-03-09 NOTE — ED Notes (Signed)
emtala completed and signed. Given report to transport team. Pt transported via [facility] transport to [place].

## 2019-03-09 NOTE — ED Notes (Signed)
Pt requesting medication for heroin withdrawals symptoms. Pt able to follow commands, states feels ill and jittery. MD notified. Will continue to monitor.

## 2019-03-09 NOTE — ED Notes (Signed)
 TRANSFER - OUT REPORT:    Verbal report given to [facility] RN(name) on Brian Stevenson  being transferred to [facility] room 460(unit) for      Report consisted of patient's Situation, Background, Assessment and   Recommendations(SBAR).     Information from the following report(s) SBAR, ED Summary, [facility] and Recent Results was reviewed with the receiving nurse.    Lines:       Opportunity for questions and clarification was provided.      Patient transported with:   [employer]

## 2019-03-09 NOTE — ED Notes (Signed)
Pt resting quietly in bed with regular rate and rhythm. Will continue to monitor.

## 2019-03-09 NOTE — ED Notes (Signed)
 SBIRT did not engage the patient in the ED because he left before shift.

## 2019-03-09 NOTE — ED Notes (Signed)
TRANSFER - OUT REPORT:    Verbal report given to Joanna RN(name) on Brian Stevenson  [facility] for      Report consisted of patient???s Situation, Background, Assessment and   Recommendations(SBAR).     Information from the following report(s) SBAR, ED Summary, MAR and Recent Results was reviewed with the receiving nurse.    Lines:       Opportunity for questions and clarification was provided.      Patient transported with:   Tech

## 2019-03-09 NOTE — Progress Notes (Signed)
Patient is a 48 year old divorced disabled male who presented to the ED as  A self referral with complaint of feeling suicidal with a plan to cut his wrist.  He was evaluated by previous SW and was recommended for admission, there were no male beds at [facility], chart [facility].   Called [facility], spoke with Joanna who stated that patient had been accepted and authorization could be obtained.  Patient has Medicare, authorization is not required.

## 2019-03-09 NOTE — ED Notes (Signed)
emtala completed and signed. Given report to transport team. [facility].

## 2019-03-09 NOTE — ED Notes (Signed)
SBIRT did not engage the patient in the ED because he left before shift.

## 2019-03-09 NOTE — ED Notes (Signed)
Pt requesting medication for heroin withdrawals symptoms. Pt able to follow commands, states feels ill and jittery. MD notified. Will continue to monitor.

## 2019-03-09 NOTE — ED Notes (Signed)
Pt resting quietly in bed with regular rate and rhythm. Will continue to monitor.

## 2019-06-11 NOTE — ED Notes (Signed)
SBIRT did telephone follow up left message

## 2019-07-05 DIAGNOSIS — R45851 Suicidal ideations: Secondary | ICD-10-CM

## 2019-07-05 NOTE — ED Notes (Signed)
Patient walked into ED with c/o SI x 1 day. Patient states he has a plan but would not disclose to this nurse.

## 2019-07-05 NOTE — ED Provider Notes (Signed)
49 yo M with history bipolar HTN who presents to the ED c/o suicide ideation onset today. States he had a plan to walk into traffic, denies doing anything to hurt himself today. Denies any pain except for where he cut his finger yesterday when he fell to the ground and his left shoulder, denies hitting head or LOC. States he drinks etoh daily, last drink was this morning, notes he also used heroin and cocaine this morning. Notes that he took all his medications this morning. Denies HI, AVH.    The history is provided by the patient.   Mental Health Problem   Pertinent negatives include no agitation.        Past Medical History:   Diagnosis Date   ??? Bipolar 1 disorder ([place])    ??? HTN (hypertension)        Past Surgical History:   Procedure Laterality Date   ??? HX HIP REPLACEMENT     ??? HX LUMBAR FUSION     ??? HX ORTHOPAEDIC           History reviewed. No pertinent family history.    Social History     Socioeconomic History   ??? Marital status: DIVORCED     Spouse name: Not on file   ??? Number of children: Not on file   ??? Years of education: Not on file   ??? Highest education level: Not on file   Occupational History   ??? Not on file   Social Needs   ??? Financial resource strain: Not on file   ??? Food insecurity     Worry: Not on file     Inability: Not on file   ??? Transportation needs     Medical: Not on file     Non-medical: Not on file   Tobacco Use   ??? Smoking status: Current Every Day Smoker     Packs/day: 1.00   Substance and Sexual Activity   ??? Alcohol use: Not Currently   ??? Drug use: Yes     Types: Cocaine, Heroin   ??? Sexual activity: Not on file   Lifestyle   ??? Physical activity     Days per week: Not on file     Minutes per session: Not on file   ??? Stress: Not on file   Relationships   ??? Social Product manager on phone: Not on file     Gets together: Not on file     Attends religious service: Not on file     Active member of club or organization: Not on file     Attends meetings of clubs or organizations: Not on  file     Relationship status: Not on file   ??? Intimate partner violence     Fear of current or ex partner: Not on file     Emotionally abused: Not on file     Physically abused: Not on file     Forced sexual activity: Not on file   Other Topics Concern   ??? Not on file   Social History Narrative   ??? Not on file         ALLERGIES: Trazodone    Review of Systems   Constitutional: Negative for chills and fever.   HENT: Negative for congestion and rhinorrhea.    Eyes: Negative for discharge and redness.   Respiratory: Negative for cough and shortness of breath.    Cardiovascular: Negative for chest pain and leg  swelling.   Gastrointestinal: Negative for abdominal pain, diarrhea and vomiting.   Genitourinary: Negative for dysuria and frequency.   Musculoskeletal: Negative for back pain and neck pain.   Skin: Negative for color change and wound.   Neurological: Negative for syncope and headaches.   Psychiatric/Behavioral: Positive for suicidal ideas. Negative for agitation and behavioral problems.       Vitals:    07/05/19 2123   BP: 119/87   Pulse: 99   Resp: 16   Temp: 98.3 ??F (36.8 ??C)   SpO2: 97%            Physical Exam  Vitals signs and nursing note reviewed.   Constitutional:       General: He is not in acute distress.     Appearance: Normal appearance.   HENT:      Head: Normocephalic and atraumatic.      Nose: No congestion or rhinorrhea.      Mouth/Throat:      Mouth: Mucous membranes are moist.      Pharynx: Oropharynx is clear.   Eyes:      General: No scleral icterus.     Conjunctiva/sclera: Conjunctivae normal.   Neck:      Musculoskeletal: Neck supple.   Cardiovascular:      Rate and Rhythm: Normal rate and regular rhythm.   Pulmonary:      Effort: Pulmonary effort is normal. No respiratory distress.   Abdominal:      General: Abdomen is flat. There is no distension.   Musculoskeletal:         General: No swelling or deformity.   Skin:     General: Skin is warm and dry.      Capillary Refill: Capillary  refill takes less than 2 seconds.      Comments: Abrasion left shoulder, old superficial laceration left 2nd finger   Neurological:      General: No focal deficit present.      Mental Status: He is alert and oriented to person, place, and time.      Comments: Gait intact   Psychiatric:         Mood and Affect: Mood normal.         Behavior: Behavior normal.          MDM  Number of Diagnoses or Management Options  Suicidal ideation  Diagnosis management comments: 65 M who presents to the ED c/o SI onset today with plan to walk into traffic, ddx includes substance induced v mood disorder.     Plan:  - CBC CMP UDS UA  - BH consult  - Transition care pending behavioral health recommendations         Procedures

## 2019-07-05 NOTE — ED Notes (Signed)
Awake and calm.will continue to monitor.

## 2019-07-05 NOTE — ED Notes (Signed)
Patient provided with meal tray.

## 2019-07-05 NOTE — ED Notes (Signed)
Patient states his last drink was 8 hours ago. Upon assessment patient asking for food and drinks

## 2019-07-05 NOTE — ED Notes (Signed)
Patient states his last drink was 8 hours ago. Upon assessment patient asking for food and drinks

## 2019-07-05 NOTE — ED Triage Notes (Signed)
Patient walked into ED with c/o SI x 1 day. Patient states he has a plan but would not disclose to this nurse.

## 2019-07-05 NOTE — ED Provider Notes (Signed)
49 yo M with history bipolar HTN who presents to the ED c/o suicide ideation onset today. States he had a plan to walk into traffic, denies doing anything to hurt himself today. Denies any pain except for where he cut his finger yesterday when he fell to the ground and his left shoulder, denies hitting head or LOC. States he drinks etoh daily, last drink was this morning, notes he also used heroin and cocaine this morning. Notes that he took all his medications this morning. Denies HI, AVH.    The history is provided by the patient.   Mental Health Problem   Pertinent negatives include no agitation.        Past Medical History:   Diagnosis Date   ??? Bipolar 1 disorder ([place])    ??? HTN (hypertension)        Past Surgical History:   Procedure Laterality Date   ??? HX HIP REPLACEMENT     ??? HX LUMBAR FUSION     ??? HX ORTHOPAEDIC           History reviewed. No pertinent family history.    Social History     Socioeconomic History   ??? Marital status: DIVORCED     Spouse name: Not on file   ??? Number of children: Not on file   ??? Years of education: Not on file   ??? Highest education level: Not on file   Occupational History   ??? Not on file   Social Needs   ??? Financial resource strain: Not on file   ??? Food insecurity     Worry: Not on file     Inability: Not on file   ??? Transportation needs     Medical: Not on file     Non-medical: Not on file   Tobacco Use   ??? Smoking status: Current Every Day Smoker     Packs/day: 1.00   Substance and Sexual Activity   ??? Alcohol use: Not Currently   ??? Drug use: Yes     Types: Cocaine, Heroin   ??? Sexual activity: Not on file   Lifestyle   ??? Physical activity     Days per week: Not on file     Minutes per session: Not on file   ??? Stress: Not on file   Relationships   ??? Social Product manager on phone: Not on file     Gets together: Not on file     Attends religious service: Not on file     Active member of club or organization: Not on file      Attends meetings of clubs or organizations: Not on file     Relationship status: Not on file   ??? Intimate partner violence     Fear of current or ex partner: Not on file     Emotionally abused: Not on file     Physically abused: Not on file     Forced sexual activity: Not on file   Other Topics Concern   ??? Not on file   Social History Narrative   ??? Not on file         ALLERGIES: Trazodone    Review of Systems   Constitutional: Negative for chills and fever.   HENT: Negative for congestion and rhinorrhea.    Eyes: Negative for discharge and redness.   Respiratory: Negative for cough and shortness of breath.    Cardiovascular: Negative for chest pain and leg  swelling.   Gastrointestinal: Negative for abdominal pain, diarrhea and vomiting.   Genitourinary: Negative for dysuria and frequency.   Musculoskeletal: Negative for back pain and neck pain.   Skin: Negative for color change and wound.   Neurological: Negative for syncope and headaches.   Psychiatric/Behavioral: Positive for suicidal ideas. Negative for agitation and behavioral problems.       Vitals:    07/05/19 2123   BP: 119/87   Pulse: 99   Resp: 16   Temp: 98.3 ??F (36.8 ??C)   SpO2: 97%            Physical Exam  Vitals signs and nursing note reviewed.   Constitutional:       General: He is not in acute distress.     Appearance: Normal appearance.   HENT:      Head: Normocephalic and atraumatic.      Nose: No congestion or rhinorrhea.      Mouth/Throat:      Mouth: Mucous membranes are moist.      Pharynx: Oropharynx is clear.   Eyes:      General: No scleral icterus.     Conjunctiva/sclera: Conjunctivae normal.   Neck:      Musculoskeletal: Neck supple.   Cardiovascular:      Rate and Rhythm: Normal rate and regular rhythm.   Pulmonary:      Effort: Pulmonary effort is normal. No respiratory distress.   Abdominal:      General: Abdomen is flat. There is no distension.   Musculoskeletal:         General: No swelling or deformity.   Skin:      General: Skin is warm and dry.      Capillary Refill: Capillary refill takes less than 2 seconds.      Comments: Abrasion left shoulder, old superficial laceration left 2nd finger   Neurological:      General: No focal deficit present.      Mental Status: He is alert and oriented to person, place, and time.      Comments: Gait intact   Psychiatric:         Mood and Affect: Mood normal.         Behavior: Behavior normal.          MDM  Number of Diagnoses or Management Options  Suicidal ideation  Diagnosis management comments: 62 M who presents to the ED c/o SI onset today with plan to walk into traffic, ddx includes substance induced v mood disorder.     Plan:  - CBC CMP UDS UA  - BH consult  - Transition care pending behavioral health recommendations         Procedures

## 2019-07-05 NOTE — ED Notes (Signed)
Awake and calm.will continue to monitor.

## 2019-07-06 ENCOUNTER — Inpatient Hospital Stay
Admit: 2019-07-06 | Discharge: 2019-07-07 | Disposition: A | Discharge status: Other Acute Inpatient Hospital | Payer: MEDICARE | Attending: Emergency Medicine

## 2019-07-06 LAB — CBC WITH AUTOMATED DIFF
ABS. BASOPHILS: 0 10*3/uL (ref 0.0–0.2)
ABS. EOSINOPHILS: 0 10*3/uL (ref 0.0–0.7)
ABS. LYMPHOCYTES: 1.5 10*3/uL (ref 1.2–3.4)
ABS. MONOCYTES: 0.5 10*3/uL — ABNORMAL LOW (ref 1.1–3.2)
ABS. NEUTROPHILS: 5.9 10*3/uL (ref 1.4–6.5)
BASOPHILS: 0 % (ref 0–2)
EOSINOPHILS: 0 % (ref 0–5)
HCT: 43.3 % (ref 36.8–45.2)
HGB: 14.8 g/dL (ref 12.8–15.0)
IMMATURE GRANULOCYTES: 0 % (ref 0.0–5.0)
LYMPHOCYTES: 19 % (ref 16–40)
MCH: 31.5 PG — ABNORMAL HIGH (ref 27–31)
MCHC: 34.2 g/dL (ref 32–36)
MCV: 92.1 FL (ref 81–99)
MONOCYTES: 6 % (ref 0–12)
MPV: 9.1 FL (ref 7.4–10.4)
NEUTROPHILS: 75 % — ABNORMAL HIGH (ref 40–70)
PLATELET: 214 10*3/uL (ref 140–450)
RBC: 4.7 M/uL (ref 4.0–5.2)
RDW: 11.6 % (ref 11.5–14.5)
WBC: 7.9 10*3/uL (ref 4.8–10.8)

## 2019-07-06 LAB — UA WITH REFLEX MICRO AND CULTURE
Bilirubin: NEGATIVE
Blood: NEGATIVE
Glucose: NEGATIVE mg/dL
Ketone: NEGATIVE mg/dL
Leukocyte Esterase: NEGATIVE
Nitrites: NEGATIVE
Protein: NEGATIVE mg/dL
Specific gravity: 1.025 (ref 1.003–1.030)
Urobilinogen: 0.2 EU/dL (ref 0.2–1.0)
pH (UA): 6 (ref 5.0–7.0)

## 2019-07-06 LAB — COVID-19,INFLUENZA A/B,RSV PANEL
Influenza A by PCR: NEGATIVE
Influenza B by PCR: NEGATIVE
RSV by PCR: NEGATIVE
SARS-CoV-2 by PCR: NEGATIVE

## 2019-07-06 LAB — METABOLIC PANEL, COMPREHENSIVE
A-G Ratio: 1.6 (ref 1.0–3.1)
ALT (SGPT): 34 U/L (ref 12–78)
AST (SGOT): 32 U/L (ref 15–37)
Albumin: 4.2 g/dL (ref 3.4–5.0)
Alk. phosphatase: 125 U/L — ABNORMAL HIGH (ref 46–116)
Anion gap: 13 mmol/L (ref 10–17)
BUN/Creatinine ratio: 19 (ref 6.0–20.0)
BUN: 17 MG/DL (ref 7–18)
Bilirubin, total: 1.3 MG/DL — ABNORMAL HIGH (ref 0.2–1.0)
CO2: 27 mmol/L (ref 21–32)
Calcium: 9.5 MG/DL (ref 8.5–10.1)
Chloride: 102 mmol/L (ref 98–107)
Creatinine: 0.91 MG/DL (ref 0.6–1.3)
GFR est AA: >60 mL/min/{1.73_m2} (ref >60)
GFR est non-AA: >60 mL/min/{1.73_m2} (ref >60)
Globulin: 2.6 g/dL
Glucose: 131 mg/dL — ABNORMAL HIGH (ref 74–106)
Potassium: 4.3 mmol/L (ref 3.5–5.1)
Protein, total: 6.8 g/dL (ref 6.4–8.2)
Sodium: 138 mmol/L (ref 136–145)

## 2019-07-06 LAB — DRUG SCREEN, URINE
AMPHETAMINES: NEGATIVE
Amphetamine Screen, Urine: NEGATIVE
BARBITURATES: NEGATIVE
BENZODIAZEPINES: NEGATIVE
Barbiturate Screen, Urine: NEGATIVE
Benzodiazepine Screen, Urine: NEGATIVE
Buprenorphine Screen, Urine: NEGATIVE
Buprenorphine screen, urine: NEGATIVE
COCAINE: POSITIVE — AB
Cocaine Screen Urine: POSITIVE — AB
METHADONE: POSITIVE — AB
Methadone Screen, Urine: POSITIVE — AB
Methamphetamines: NEGATIVE
Methamphetamines: NEGATIVE
OPIATES: POSITIVE — AB
OXYCODONE SCREEN: NEGATIVE
Opiate Screen, Urine: POSITIVE — AB
Oxycodone Screen: NEGATIVE
PCP Screen, Urine: NEGATIVE
PCP(PHENCYCLIDINE): NEGATIVE
PROPOXYPHENE,PPX: NEGATIVE
PROPOXYPHENE: NEGATIVE
THC (TH-CANNABINOL): NEGATIVE
THC Screen, Urine: NEGATIVE
TRICYCLICS,TCAT: NEGATIVE
TRICYCLICS: NEGATIVE

## 2019-07-06 LAB — ETHYL ALCOHOL
ALCOHOL(ETHYL),SERUM: <3 MG/DL (ref <3.0)
Ethyl Alcohol: <3 MG/DL (ref <3.0)

## 2019-07-06 LAB — COMPREHENSIVE METABOLIC PANEL
ALT: 34 U/L (ref 12–78)
AST: 32 U/L (ref 15–37)
Albumin/Globulin Ratio: 1.6 (ref 1.0–3.1)
Albumin: 4.2 g/dL (ref 3.4–5.0)
Alkaline Phosphatase: 125 U/L — ABNORMAL HIGH (ref 46–116)
Anion Gap: 13 mmol/L (ref 10–17)
BUN: 17 MG/DL (ref 7–18)
Bun/Cre Ratio: 19 (ref 6.0–20.0)
CO2: 27 mmol/L (ref 21–32)
Calcium: 9.5 MG/DL (ref 8.5–10.1)
Chloride: 102 mmol/L (ref 98–107)
Creatinine: 0.91 MG/DL (ref 0.6–1.3)
EGFR IF NonAfrican American: >60 mL/min/{1.73_m2} (ref >60)
GFR African American: >60 mL/min/{1.73_m2} (ref >60)
Globulin: 2.6 g/dL
Glucose: 131 mg/dL — ABNORMAL HIGH (ref 74–106)
Potassium: 4.3 mmol/L (ref 3.5–5.1)
Sodium: 138 mmol/L (ref 136–145)
Total Bilirubin: 1.3 MG/DL — ABNORMAL HIGH (ref 0.2–1.0)
Total Protein: 6.8 g/dL (ref 6.4–8.2)

## 2019-07-06 LAB — CBC WITH AUTO DIFFERENTIAL
Basophils %: 0 % (ref 0–2)
Basophils Absolute: 0 10*3/uL (ref 0.0–0.2)
Eosinophils %: 0 % (ref 0–5)
Eosinophils Absolute: 0 10*3/uL (ref 0.0–0.7)
Hematocrit: 43.3 % (ref 36.8–45.2)
Hemoglobin: 14.8 g/dL (ref 12.8–15.0)
Immature Granulocytes: 0 % (ref 0.0–5.0)
Lymphocytes %: 19 % (ref 16–40)
Lymphocytes Absolute: 1.5 10*3/uL (ref 1.2–3.4)
MCH: 31.5 PG — ABNORMAL HIGH (ref 27–31)
MCHC: 34.2 g/dL (ref 32–36)
MCV: 92.1 FL (ref 81–99)
MPV: 9.1 FL (ref 7.4–10.4)
Monocytes %: 6 % (ref 0–12)
Monocytes Absolute: 0.5 10*3/uL — ABNORMAL LOW (ref 1.1–3.2)
Neutrophils %: 75 % — ABNORMAL HIGH (ref 40–70)
Neutrophils Absolute: 5.9 10*3/uL (ref 1.4–6.5)
Platelets: 214 10*3/uL (ref 140–450)
RBC: 4.7 M/uL (ref 4.0–5.2)
RDW: 11.6 % (ref 11.5–14.5)
WBC: 7.9 10*3/uL (ref 4.8–10.8)

## 2019-07-06 LAB — URINALYSIS WITH REFLEX TO CULTURE
Bilirubin, Urine: NEGATIVE
Blood, Urine: NEGATIVE
Glucose, Ur: NEGATIVE mg/dL
Ketones, Urine: NEGATIVE mg/dL
Leukocyte Esterase, Urine: NEGATIVE
Nitrite, Urine: NEGATIVE
Protein, UA: NEGATIVE mg/dL
Specific Gravity, UA: 1.025 (ref 1.003–1.030)
Urobilinogen, UA, POCT: 0.2 EU/dL (ref 0.2–1.0)
pH, UA: 6 (ref 5.0–7.0)

## 2019-07-06 LAB — COVID-19 & INFLUENZA COMBO
RSV By PCR: NEGATIVE
Rapid Influenza A By PCR: NEGATIVE
Rapid Influenza B By PCR: NEGATIVE
SARS-CoV-2: NEGATIVE

## 2019-07-06 MED ORDER — CHLORDIAZEPOXIDE 25 MG CAP
25 mg | ORAL | Status: AC
Start: 2019-07-06 — End: 2019-07-07
  Administered 2019-07-07 (×2): via ORAL

## 2019-07-06 MED ORDER — LOPERAMIDE 2 MG CAP
2 mg | ORAL | Status: DC | PRN
Start: 2019-07-06 — End: 2019-07-07

## 2019-07-06 MED ORDER — THIAMINE MONONITRATE 100 MG TABLET
100 mg | Freq: Every day | ORAL | Status: DC
Start: 2019-07-06 — End: 2019-07-07
  Administered 2019-07-07: 16:00:00 via ORAL

## 2019-07-06 MED ORDER — DICYCLOMINE 10 MG CAP
10 mg | ORAL | Status: AC
Start: 2019-07-06 — End: 2019-07-06
  Administered 2019-07-06: 09:00:00 via ORAL

## 2019-07-06 MED ORDER — NICOTINE 21 MG/24 HR DAILY PATCH
21 mg/24 hr | Freq: Every day | TRANSDERMAL | Status: DC
Start: 2019-07-06 — End: 2019-07-07

## 2019-07-06 MED ORDER — DIPHENHYDRAMINE 25 MG CAP
25 mg | ORAL | Status: AC
Start: 2019-07-06 — End: 2019-07-06
  Administered 2019-07-06: 09:00:00 via ORAL

## 2019-07-06 MED ORDER — DICYCLOMINE 10 MG CAP
10 mg | Freq: Three times a day (TID) | ORAL | Status: DC | PRN
Start: 2019-07-06 — End: 2019-07-07
  Administered 2019-07-07: 05:00:00 via ORAL

## 2019-07-06 MED ORDER — METHADONE 10 MG TAB
10 mg | ORAL | Status: DC
Start: 2019-07-06 — End: 2019-07-06
  Administered 2019-07-06: 16:00:00 via ORAL

## 2019-07-06 MED ORDER — RISPERIDONE 1 MG TAB, RAPID DISSOLVE
1 mg | Freq: Every day | ORAL | Status: DC
Start: 2019-07-06 — End: 2019-07-07
  Administered 2019-07-06 – 2019-07-07 (×2): via ORAL

## 2019-07-06 MED ORDER — LORAZEPAM 2 MG/ML IJ SOLN
2 mg/mL | INTRAMUSCULAR | Status: DC
Start: 2019-07-06 — End: 2019-07-07
  Administered 2019-07-06: 18:00:00 via INTRAVENOUS

## 2019-07-06 MED ORDER — CLONIDINE 0.3 MG TAB
0.3 mg | ORAL | Status: AC
Start: 2019-07-06 — End: 2019-07-06

## 2019-07-06 MED ORDER — METHADONE 10 MG/ML ORAL CONCENTRATE
10 mg/mL | ORAL | Status: AC
Start: 2019-07-06 — End: 2019-07-06
  Administered 2019-07-06: 13:00:00 via ORAL

## 2019-07-06 MED ORDER — NICOTINE 21 MG/24 HR DAILY PATCH
21 mg/24 hr | Freq: Every day | TRANSDERMAL | Status: DC
Start: 2019-07-06 — End: 2019-07-06

## 2019-07-06 MED ORDER — GABAPENTIN 100 MG CAP
100 mg | Freq: Three times a day (TID) | ORAL | Status: DC
Start: 2019-07-06 — End: 2019-07-07
  Administered 2019-07-07: 03:00:00 via ORAL

## 2019-07-06 MED ORDER — CHLORDIAZEPOXIDE 25 MG CAP
25 mg | ORAL | Status: DC
Start: 2019-07-06 — End: 2019-07-07

## 2019-07-06 MED FILL — LORAZEPAM 2 MG/ML IJ SOLN: 2 mg/mL | INTRAMUSCULAR | Qty: 1

## 2019-07-06 MED FILL — METHADONE 10 MG/ML ORAL CONCENTRATE: 10 mg/mL | ORAL | Qty: 10

## 2019-07-06 MED FILL — CLONIDINE 0.3 MG TAB: 0.3 mg | ORAL | Qty: 1

## 2019-07-06 MED FILL — NICOTINE 21 MG/24 HR DAILY PATCH: 21 mg/24 hr | TRANSDERMAL | Qty: 1

## 2019-07-06 MED FILL — RISPERIDONE 1 MG TAB, RAPID DISSOLVE: 1 mg | ORAL | Qty: 1

## 2019-07-06 MED FILL — DIPHENHYDRAMINE 25 MG CAP: 25 mg | ORAL | Qty: 2

## 2019-07-06 MED FILL — DICYCLOMINE 10 MG CAP: 10 mg | ORAL | Qty: 2

## 2019-07-06 NOTE — ED Notes (Signed)
Pt is sleeping comfortably and in no apparent distress at this time; will continue to monitor.

## 2019-07-06 NOTE — ED Notes (Signed)
 The patient meets criteria for constant observation:    Select from the following indications (Choose all that apply):    Suicidal or homicidal patients with no plan, or low to moderate lethal plans, but no means to carry them out. [x]                                     Patients with increased risk factors for suicide, inappropriate mood/affect/behavior, and no suicidal ideation. []                                     Patients presenting with suicide attempt that was non-lethal attempt (i.e. scratched wrists). []                                     Patients who are moderately agitated with risk for potential violence. []                                     Patients who are currently being held on an Emergency Petition (ED only). []                                     Any other patients with a potential risk for harm to self or others. [x] 

## 2019-07-06 NOTE — ED Notes (Signed)
Assessment is unchanged,up to the bathroom

## 2019-07-06 NOTE — Progress Notes (Signed)
SW called to complete authorization for Pt's admission to [facility].    Pt has Medicare Part A and B.  He does not need authorization for inpatient admission.    [facility]  Voluntary Admission

## 2019-07-06 NOTE — ED Notes (Signed)
Bedside and Verbal shift change report given to Charlene, RN (oncoming nurse) by Bailey, RN (offgoing nurse). Report included the following information SBAR, ED Summary and MAR.

## 2019-07-06 NOTE — Progress Notes (Signed)
This Pt walked to the ED and reported being SI for a Day and no plan .  When this SW saw the Pt he is reporting that he was walking toward the railroad track on [address] to try and get hit by a train  This is reporting that he has no prior history of SI attempts but is feeling very depressed and hopeless The Pt is reporting that he still thinking of going on a railroad tract to end his life .  This Pt is reporting that he was discharged from [facility]  due to using illegal drugs and alcohol daily  The Pt is going though alcohol withdrawal  While in the ED and the medical staff has been  was alerted  This Pt is reporting that he drinks 6- 40's of steel, daily and he uses a gram of cocaine and heroin daily  This Pt is in a methadone Program at [facility] on [address] and receives 100 mg daily.  This Pt is reporting that he started using cocaine and heroin at the age of 49. This Pt is reporting that he has not taken his psych medications for 2 or 3 weeks    The Pt had a psychiatrist with the [facility] program  The Pt is reporting poor sleep and poor eating.  This Pt is reporting that he has been accepted in a drug rehab program in [place] called [facility] and when he is ready they will provide transportation for him to come to [place].   This Pt is denying HI and hallucinations.  This Pt is alert and oriented times three. The Pt made good eye contact. The Pt thought process is logical  The Pt's speech is soft and low in tone . The Pt is in body pain due to withdrawal   The Pt is cooperative and feels helpless   The Pt feels he needs to get back on his psych medication ASAP.  This Pt signed the voluntary form for admission  Other information on the Pt.  Pt is the oldest of 10 children born in [place] and raised by their parents  The Pt has a 12 th grade education /no military history and no legal issues This Pt gets SSI for deprssiona and pain  The Pt has no children and little support.     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

## 2019-07-06 NOTE — ED Notes (Signed)
Assumed care of patient, patient is A&O x3 no complaints at this time patient did inquire about methadone administration.

## 2019-07-06 NOTE — ED Notes (Signed)
Called [facility] located at [address], [place] spoke with Darl Pikes, LPN she verified that Brian Stevenson receives 100mg  of Methadone daily.

## 2019-07-06 NOTE — ED Notes (Signed)
2:54 AM    The patient was signed out to me and care transferred to me by the outgoing provider Dr. Arn Medal at the beginning of my shift.    Briefly, the patient is a 49 y.o. year old male presenting with history of bipolar disorder and hypertension now presents complaining of being suicidal.  Patient states that he feels that he will walk him to traffic or jump onto a railroad track.  He admits to drinking on a regular basis and using heroin and cocaine.     Studies performed so far were significant for white count of 7.9 with an H&H of 14.8 and 43.3 urinalysis which is negative sodium is 138 potassium is 4.3.  BUN is 17 and creatinine is 0.91 drug screen is positive for cocaine methadone and opiates.  Alcohol level is less than 3    The following items were pending at the time of sign out and discussed with the outgoing provider:    []  Labs  []  Xrays  []  Ultrasound  CT: []  Head   []  Neck   []  Chest   []  Abd/Pelvis    []  Extremity   []  C,T, or L spine  []  Other:   ---------------------------------------------------------------  [x]  Psych Eval  []  Sobriety  []  Symptomatic improvement  []  Transportation  []  Other:        Plan for disposition is:     []  Discharge   []  Likely Discharge [x]  Admit  []  Likely Admit  []  Pending   []  Transfer or Likely Transfer           []  This patient is in critical condition and was signed out at bedside; a full plan,                 dispo, and discussion of the patient was provided to the oncoming provider.

## 2019-07-06 NOTE — Progress Notes (Signed)
 07/06/19 2223   Attitude and Behavior   General Attitude Cooperative   Affect Dull;Flat   Mood Depressed   Insight Fair   Judgement Impaired   Memory  Intact   Thought Content Unremarkable   Hallucinations Auditory  (I'm seeing snake and the voices are telling me to leave)   Delusions None   Concentration Fair   Speech Pattern Excessively soft   Thought Process Circumstantial   Motor Activity Tremors   patient up and requesting for snack  He reported withdrawal and ciwa score of 10, patient was medicated with chlordiazepoxide 50 mg po. Patient denied SI/HI/Pain, but endorsed AVH. Patient reported that the voices are telling her to leave the hospital,a he is seeing snake. No behavior issues  observed or reported. Will continue to monitor.

## 2019-07-06 NOTE — ED Notes (Signed)
Pt is resting in bed comfortably; will continue to monitor.

## 2019-07-06 NOTE — Progress Notes (Signed)
Info was faxed to [facility] for referral to [facility]

## 2019-07-06 NOTE — Consults (Signed)
Psychiatry History and Physical    Subjective:     Date of Evaluation:  07/06/2019    History of Presenting Problem:   Per initial ED behavorial health assessment:  This Pt walked to the ED and reported being SI for a Day and no plan .  When this SW saw the Pt he is reporting that he was walking toward the railroad track on [address] to try and get hit by a train  This is reporting that he has no prior history of SI attempts but is feeling very depressed and hopeless The Pt is reporting that he still thinking of going on a railroad tract to end his life .  This Pt is reporting that he was discharged from [facility]  due to using illegal drugs and alcohol daily  The Pt is going though alcohol withdrawal  While in the ED and the medical staff has been  was alerted  This Pt is reporting that he drinks 6- 40's of steel, daily and he uses a gram of cocaine and heroin daily  This Pt is in a methadone Program at [facility] on [address] and receives 100 mg daily.  This Pt is reporting that he started using cocaine and heroin at the age of 10. This Pt is reporting that he has not taken his psych medications for 2 or 3 weeks    The Pt had a psychiatrist with the [facility] program  The Pt is reporting poor sleep and poor eating.  This Pt is reporting that he has been accepted in a drug rehab program in [place] called [facility] and when he is ready they will provide transportation for him to come to [place].   This Pt is denying HI and hallucinations.  This Pt is alert and oriented times three. The Pt made good eye contact. The Pt thought process is logical  The Pt's speech is soft and low in tone . The Pt is in body pain due to withdrawal   The Pt is cooperative and feels helpless   The Pt feels he needs to get back on his psych medication ASAP.  This Pt signed the voluntary form for admission  Other information on the Pt.  Pt is the oldest of 10 children born in [place] and raised by their parents  The Pt has  a 12 th grade education /no military history and no legal issues This Pt gets SSI for deprssiona and pain  The Pt has no children and little support.   ??  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  At 2:21 am,this SW discussed this Pt with [name] ,CRNP and the decision is to admit the Pt for in-pt mental health      Clinical Interview:  Mr. Elvyn Krohn  is a 49 y.o. WHITE OR CAUCASIAN male who self presented to the ED with suicidal ideations and a plan to walk onto train tracks. Patient has a history of bipolar disorder and depression. On evaluation, patient is cooperative and reports continued SI. He reports a history of SI attempts and previous gestures via cutting wrists or intentional overdose. Patient endorses depressive symptoms of irritability, anhedonia and hopelessness related to ongoing substance abuse. He verbalizes wanting substance recovery treatment once stabilized. Reported non adherence after last inpatient admission "  I felt better". Patient denies HI/AH/VH. Denies history of seizures and blackouts with substance abuse.    Past Psych Hx: Reports previous inpatient admissions at [facility]. Crisp review details 03/2019 inpatient admission at [facility]. Previous medication trials Zyprexa, Gabapentin, and Risperdal.    Past Substance Hx: Patient reports lengthy substance abuse history. Indicated participating in several recovery programs. Is currently receiving methadone maintenance at [facility] located at [place]. Coralie Keens, MD. Dose verified by ED RN at 100mg  methadone daily. Patient endorses daily tobacco, weekly ETOH and recent opiate relapse. Tox screen positive for substances listed below.    Results for ILAY, CAPSHAW (MRN [id number]) as of 07/06/2019 11:14   Ref. Range 07/05/2019 21:45    AMPHETAMINES Latest Ref Range: NEG   Negative   BARBITURATES Latest Ref Range: NEG   Negative   BENZODIAZEPINES Latest Ref Range: NEG   Negative   Buprenorphine screen, urine Latest Ref Range: NEG   Negative   COCAINE Latest Ref Range: NEG   Positive (A)   METHADONE Latest Ref Range: NEG   Positive (A)   OPIATES Latest Ref Range: NEG   Positive (A)   OXYCODONE SCREEN Latest Ref Range: NEG   Negative   PCP(PHENCYCLIDINE) Latest Ref Range: NEG   Negative   PROPOXYPHENE Latest Ref Range: NEG   Negative   THC (TH-CANNABINOL) Latest Ref Range: NEG   Negative   TRICYCLICS Latest Ref Range: NEG   Negative       Past Social Hx: Patient born and raised in [place]. Completed high school. Single, no children. Is estranged from family. Currently homeless and receives disability.    Patient has signed a voluntary admission for inpatient psychiatric treatment.    There are no active problems to display for this patient.    Past Medical History:   Diagnosis Date   ??? Bipolar 1 disorder ([place])    ??? HTN (hypertension)      Past Surgical History:   Procedure Laterality Date   ??? HX HIP REPLACEMENT     ??? HX LUMBAR FUSION     ??? HX ORTHOPAEDIC         History reviewed. No pertinent family history.   Social History     Tobacco Use   ??? Smoking status: Current Every Day Smoker     Packs/day: 1.00   Substance Use Topics   ??? Alcohol use: Not Currently     Prior to Admission medications    Not on File     Allergies   Allergen Reactions   ??? Trazodone Nausea and Vomiting        Review of Systems and physical exam completed by ED attending on 07/05/2018.    Objective:     Patient Vitals for the past 8 hrs:   BP Temp Pulse Resp SpO2   07/06/19 0728 125/84 98.7 ??F (37.1 ??C) 67 18 99 %   07/06/19 0607 115/71 98 ??F (36.7 ??C) 99 18 ???   07/06/19 0334 109/78 97.5 ??F (36.4 ??C) 76 18 99 %       Mental Status exam:     Appearance- is unkempt and shows poor hygiene.    Behavior- shows poor eye contact.   Speech- is slowed and is soft.    Mood- is depressed  and is withdrawn.    Affect- is constricted.   Thoughts/Intent/Plan to harm self- Ideation with plan    Thought content- denies delusions.    Thought  process- shows no evidence of impairment.    Perception- denies AH/VH.   Memory- intact.    Insight- poor.    Judgement-  poor.    Impression:      Active Problems:    * No active hospital problems. *        Plan:     Recommendations for Treatment/Conditions:  Once medically??and COVID cleared-??  Psychiatric treatment recommended while in hospital  Outpatient follow up recommended after release; refer to??outpatient??mental??provider  Voluntarily??admit the patient to inpatient psychiatric??unit  Refer to Medical for medical clearance.  Refer to Social Work for psychosocial assessment and??discharge??planning.  Refer for substance recovery services.  Initiated supportive prns while in ED.  ??  Referral To:??  Inpatient psychiatric care  ??  Competency Statement:??  At the current time, the patient is competent to make informed consent regarding their current medical care and discharge planning and/or financial decisions.

## 2019-07-06 NOTE — ED Notes (Signed)
 covid 19 sent

## 2019-07-06 NOTE — Progress Notes (Signed)
Pt is sleeping comfortably and in no apparent distress at this time; will continue to monitor.

## 2019-07-06 NOTE — Progress Notes (Signed)
Patient was seen by previous SW and inpatient psych admission was recommended, there were no beds at [facility], chart was faxed to [facility].  Also called other hospitals for bed availability but they were all full and discharges were not expected.  Called [facility] -admissions, [facility], [facility],  [facility] and [facility]. [facility], chart was still pending review. As patient has medicare, called [facility] [place] ![phone] for psych availability, was informed that there were no beds today but to fax chart over for review tomorrow  [phone], it was faxed.

## 2019-07-06 NOTE — ED Notes (Signed)
Pt is resting in bed comfortably; pt is not showing any signs of agitation or distress at this time; will continue to monitor.

## 2019-07-06 NOTE — ED Notes (Signed)
7:45 AM    The patient was signed out to me and care transferred to me by the outgoing provider Dr. Doneta Public at the beginning of my shift.    Briefly, the patient is a 49 y.o. year old male presenting with bipolar disorder and SI      Patient initially evaluated on 07/04/18 @ 2212. Evaluation by behavioral health, Ms. Lokeman @ 07/05/18 @ 0322.   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    Axis 11 Deferred  Asxis 111 HTN  Axis V 35  The following items were pending at the time of sign out and discussed with the outgoing provider:    []  Labs  []  Xrays  []  Ultrasound  CT: []  Head   []  Neck   []  Chest   []  Abd/Pelvis    []  Extremity   []  C,T, or L spine  []  Other:   ---------------------------------------------------------------  []  Psych Eval  []  Sobriety  []  Symptomatic improvement  []  Transportation  []  Other:        Plan for disposition is:     []  Discharge   []  Likely Discharge []  Admit  []  Likely Admit  []  Pending   [x]  Transfer or Likely Transfer           []  This patient is in critical condition and was signed out at bedside; a full plan,                 dispo, and discussion of the patient was provided to the oncoming provider.   This time requested to order methadone which was verified as 100 mg for this patient.  He is pending transfer to at this time for behavioral health inpatient services.    , MD

## 2019-07-06 NOTE — ED Notes (Signed)
Awaken for medication.refused benadryl

## 2019-07-06 NOTE — ED Notes (Signed)
Pt is resting in bed comfortably; pt is not showing any signs of agitation or distress at this time; will continue to monitor.

## 2019-07-06 NOTE — ED Notes (Signed)
The patient meets criteria for constant observation:    Select from the following indications (Choose all that apply):    Suicidal or homicidal patients with no plan, or low to moderate lethal plans, but no means to carry them out. [x]                                    Patients with increased risk factors for suicide, inappropriate mood/affect/behavior, and no suicidal ideation. []                                    Patients presenting with suicide attempt that was non-lethal attempt (i.e. scratched wrists). []                                    Patients who are moderately agitated with risk for potential violence. []                                    Patients who are currently being held on an Emergency Petition (ED only). []                                    Any other patients with a potential risk for harm to self or others. [x]

## 2019-07-06 NOTE — ED Notes (Signed)
Awaken for medication.refused benadryl

## 2019-07-06 NOTE — Progress Notes (Addendum)
Patient was seen by previous SW and inpatient psych admission was recommended, there were no beds at [facility], chart was faxed to [facility].  Also called other hospitals for bed availability but they were all full and discharges were not expected.  Called [facility] -admissions, [facility], [facility],  [facility] and [facility]. Called [facility], chart was still pending review. As patient has medicare, called [facility] [place] ![phone] for psych availability, was informed that there were no beds today but to fax chart over for review tomorrow  [phone], it was faxed.

## 2019-07-06 NOTE — ED Notes (Signed)
2:54 AM    The patient was signed out to me and care transferred to me by the outgoing provider Dr. Bolganio at the beginning of my shift.    Briefly, the patient is a 48 y.o. year old male presenting with history of bipolar disorder and hypertension now presents complaining of being suicidal.  Patient states that he feels that he will walk him to traffic or jump onto a railroad track.  He admits to drinking on a regular basis and using heroin and cocaine.     Studies performed so far were significant for white count of 7.9 with an H&H of 14.8 and 43.3 urinalysis which is negative sodium is 138 potassium is 4.3.  BUN is 17 and creatinine is 0.91 drug screen is positive for cocaine methadone and opiates.  Alcohol level is less than 3    The following items were pending at the time of sign out and discussed with the outgoing provider:    [] Labs  [] Xrays  [] Ultrasound  CT: [] Head   [] Neck   [] Chest   [] Abd/Pelvis    [] Extremity   [] C,T, or L spine  [] Other:   ---------------------------------------------------------------  [x] Psych Eval  [] Sobriety  [] Symptomatic improvement  [] Transportation  [] Other:        Plan for disposition is:     [] Discharge   [] Likely Discharge [x] Admit  [] Likely Admit  [] Pending   [] Transfer or Likely Transfer           [] This patient is in critical condition and was signed out at bedside; a full plan,                 dispo, and discussion of the patient was provided to the oncoming provider.

## 2019-07-06 NOTE — ED Notes (Signed)
Assessment is unchanged,up to the bathroom

## 2019-07-06 NOTE — ED Notes (Signed)
Pt is sleeping comfortably and in no apparent distress at this time; will continue to monitor.

## 2019-07-06 NOTE — Consults (Addendum)
**Note De-Identified [name] Filter Obfuscation** Psychiatry History and Physical    Subjective:     Date of Evaluation:  07/06/2019    History of Presenting Problem:   Per initial ED behavorial health assessment:   This Pt walked to the ED and reported being SI for a Day and no plan .  When this SW saw the Pt he is reporting that he was walking toward the railroad track on [address] to try and get hit by a train  This is reporting that he has no prior history of SI attempts but is feeling very depressed and hopeless The Pt is reporting that he still thinking of going on a railroad tract to end his life .  This Pt is reporting that he was discharged from [facility]  due to using illegal drugs and alcohol daily  The Pt is going though alcohol withdrawal  While in the ED and the medical staff has been  was alerted  This Pt is reporting that he drinks 6- 40's of steel, daily and he uses a gram of cocaine and heroin daily  This Pt is in a methadone Program at [facility] on [address] and receives 100 mg daily.  This Pt is reporting that he started using cocaine and heroin at the age of 11. This Pt is reporting that he has not taken his psych medications for 2 or 3 weeks    The Pt had a psychiatrist with the [facility] program  The Pt is reporting poor sleep and poor eating.  This Pt is reporting that he has been accepted in a drug rehab program in [place] called [facility] and when he is ready they will provide transportation for him to come to [place].   This Pt is denying HI and hallucinations.  This Pt is alert and oriented times three. The Pt made good eye contact. The Pt thought process is logical  The Pt's speech is soft and low in tone . The Pt is in body pain due to withdrawal   The Pt is cooperative and feels helpless   The Pt feels he needs to get back on his psych medication ASAP.  This Pt signed the voluntary form for admission  Other information on the Pt.  Pt is the oldest of 10 children born in [place] and raised by their parents  The Pt has a 12 th grade education /no military history and no legal issues This Pt gets SSI for deprssiona and pain  The Pt has no children and little support.   ??   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  At 2:21 am,this SW discussed this Pt with Brian Stevenson ,CRNP and the decision is to admit the Pt for in-pt mental health      Clinical Interview:  Brian Stevenson  is a 49 y.o. WHITE OR CAUCASIAN male who self presented to the ED with suicidal ideations and a plan to walk onto train tracks. Patient has a history of bipolar disorder and depression. On evaluation, patient is cooperative and reports continued SI. He reports a history of SI attempts and previous gestures Brian Stevenson cutting wrists or intentional overdose. Patient endorses depressive symptoms of irritability, anhedonia and hopelessness related to ongoing substance abuse. He verbalizes wanting substance recovery treatment once stabilized. Reported non adherence after last inpatient **Note De-Identified Brian Stevenson Obfuscation** admission "I felt better". Patient denies HI/AH/VH. Denies history of seizures and blackouts with substance abuse.    Past Psych Hx: Reports previous inpatient admissions at [facility]. Crisp review details 03/2019 inpatient admission at [facility]. Previous medication trials Zyprexa, Gabapentin, and Risperdal.    Past Substance Hx: Patient reports lengthy substance abuse history. Indicated participating in several recovery programs. Is currently receiving methadone maintenance at [facility] located at [address], [place]. Dose verified by ED RN at 100mg  methadone daily. Patient endorses daily tobacco, weekly ETOH and recent opiate relapse. Tox screen positive for substances listed below.    Results for Brian Stevenson (MRN Brian Stevenson) as of 07/06/2019 11:14   Ref. Range 07/05/2019 21:45   AMPHETAMINES Latest Ref Range: NEG   Negative   BARBITURATES Latest Ref Range: NEG   Negative   BENZODIAZEPINES Latest Ref Range: NEG   Negative    Buprenorphine screen, urine Latest Ref Range: NEG   Negative   COCAINE Latest Ref Range: NEG   Positive (A)   METHADONE Latest Ref Range: NEG   Positive (A)   OPIATES Latest Ref Range: NEG   Positive (A)   OXYCODONE SCREEN Latest Ref Range: NEG   Negative   PCP(PHENCYCLIDINE) Latest Ref Range: NEG   Negative   PROPOXYPHENE Latest Ref Range: NEG   Negative   THC (TH-CANNABINOL) Latest Ref Range: NEG   Negative   TRICYCLICS Latest Ref Range: NEG   Negative       Past Social Hx: Patient born and raised in 09/02/2018. Completed high school. Single, no children. Is estranged from family. Currently homeless and receives disability.    Patient has signed a voluntary admission for inpatient psychiatric treatment.    There are no active problems to display for this patient.    Past Medical History:   Diagnosis Date   ??? Bipolar 1 disorder (HCC)    ??? HTN (hypertension)      Past Surgical History:   Procedure Laterality Date   ??? HX HIP REPLACEMENT     ??? HX LUMBAR FUSION     ??? HX ORTHOPAEDIC         History reviewed. No pertinent family history.   Social History     Tobacco Use   ??? Smoking status: Current Every Day Smoker     Packs/day: 1.00   Substance Use Topics   ??? Alcohol use: Not Currently     Prior to Admission medications    Not on File     Allergies   Allergen Reactions   ??? Trazodone Nausea and Vomiting        Review of Systems and physical exam completed by ED attending on 07/05/2018.    Objective:     Patient Vitals for the past 8 hrs:   BP Temp Pulse Resp SpO2   07/06/19 0728 125/84 98.7 ??F (37.1 ??C) 67 18 99 %   07/06/19 0607 115/71 98 ??F (36.7 ??C) 99 18 ???   07/06/19 0334 109/78 97.5 ??F (36.4 ??C) 76 18 99 %       Mental Status exam:     Appearance- is unkempt and shows poor hygiene.    Behavior- shows poor eye contact.   Speech- is slowed and is soft.    Mood- is depressed and is withdrawn.    Affect- is constricted.   Thoughts/Intent/Plan to harm self- Ideation with plan    Thought content- denies delusions. **Note De-Identified Ilisa Hayworth Obfuscation** Thought process- shows no evidence of impairment.    Perception- denies AH/VH.   Memory- intact.    Insight- poor.    Judgement-  poor.    Impression:      Active Problems:    * No active hospital problems. *        Plan:     Recommendations for Treatment/Conditions:  Once medically??and COVID cleared-??  Psychiatric treatment recommended while in hospital  Outpatient follow up recommended after release; refer to??outpatient??mental??provider  Voluntarily??admit the patient to inpatient psychiatric??unit  Refer to Medical for medical clearance.  Refer to Social Work for psychosocial assessment and??discharge??planning.  Refer for substance recovery services.  Initiated supportive prns while in ED.  ??  Referral To:??  Inpatient psychiatric care  ??  Competency Statement:??  At the current time, the patient is competent to make informed consent regarding their current medical care and discharge planning and/or financial decisions.

## 2019-07-06 NOTE — Progress Notes (Signed)
SW called to complete authorization for Pt's admission to [facility].    Pt has Medicare Part A and B.  He does not need authorization for inpatient admission.    [facility]  Voluntary Admission

## 2019-07-06 NOTE — Progress Notes (Signed)
07/06/19 2223   Attitude and Behavior   General Attitude Cooperative   Affect Dull;Flat   Mood Depressed   Insight Fair   Judgement Impaired   Memory  Intact   Thought Content Unremarkable   Hallucinations Auditory  (I'm seeing snake and the voices are telling me to leave")   Delusions None   Concentration Fair   Speech Pattern Excessively soft   Thought Process Circumstantial   Motor Activity Tremors   patient up and requesting for snack  He reported withdrawal and ciwa score of 10, patient was medicated with chlordiazepoxide 50 mg po. Patient denied SI/HI/Pain, but endorsed AVH. Patient reported that the voices are telling her to leave the hospital,a he is seeing snake. No behavior issues  observed or reported. Will continue to monitor.

## 2019-07-06 NOTE — Progress Notes (Signed)
Info was faxed to [facility]

## 2019-07-06 NOTE — Progress Notes (Signed)
This Pt walked to the ED and reported being SI for a Day and no plan .  When this SW saw the Pt he is reporting that he was walking toward the railroad track on [address] to try and get hit by a train  This is reporting that he has no prior history of SI attempts but is feeling very depressed and hopeless The Pt is reporting that he still thinking of going on a railroad tract to end his life .  This Pt is reporting that he was discharged from [facility]  due to using illegal drugs and alcohol daily  The Pt is going though alcohol withdrawal  While in the ED and the medical staff has been  was alerted  This Pt is reporting that he drinks 6- 40's of steel, daily and he uses a gram of cocaine and heroin daily  This Pt is in a methadone Program at [facility] on [address] and receives 100 mg daily.  This Pt is reporting that he started using cocaine and heroin at the age of 48. This Pt is reporting that he has not taken his psych medications for 2 or 3 weeks    The Pt had a psychiatrist with the [facility] program  The Pt is reporting poor sleep and poor eating.  This Pt is reporting that he has been accepted in a drug rehab program in [place] called [facility] and when he is ready they will provide transportation for him to come to [place].   This Pt is denying HI and hallucinations.  This Pt is alert and oriented times three. The Pt made good eye contact. The Pt thought process is logical  The Pt's speech is soft and low in tone . The Pt is in body pain due to withdrawal   The Pt is cooperative and feels helpless   The Pt feels he needs to get back on his psych medication ASAP.  This Pt signed the voluntary form for admission  Other information on the Pt.  Pt is the oldest of 10 children born in [place] and raised by their parents  The Pt has a 12 th grade education /no military history and no legal issues This Pt gets SSI for deprssiona and pain  The Pt has no children and little support.      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

## 2019-07-06 NOTE — ED Notes (Signed)
Called [facility] located at [address] [place], [place] spoke with Lanier, LPN she verified that Brian Stevenson receives 100mg of Methadone daily.

## 2019-07-06 NOTE — ED Notes (Signed)
covid-19 sent

## 2019-07-06 NOTE — ED Notes (Signed)
7:45 AM    The patient was signed out to me and care transferred to me by the outgoing provider Dr. Parkes at the beginning of my shift.    Briefly, the patient is a 48 y.o. year old male presenting with bipolar disorder and SI      Patient initially evaluated on 07/04/18 @ 2212. Evaluation by behavioral health, Ms. Lokeman @ 07/05/18 @ 0322.   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    Axis 11 Deferred  Asxis 111 HTN  Axis V 35  The following items were pending at the time of sign out and discussed with the outgoing provider:    [] Labs  [] Xrays  [] Ultrasound  CT: [] Head   [] Neck   [] Chest   [] Abd/Pelvis    [] Extremity   [] C,T, or L spine  [] Other:   ---------------------------------------------------------------  [] Psych Eval  [] Sobriety  [] Symptomatic improvement  [] Transportation  [] Other:        Plan for disposition is:     [] Discharge   [] Likely Discharge [] Admit  [] Likely Admit  [] Pending   [x] Transfer or Likely Transfer           [] This patient is in critical condition and was signed out at bedside; a full plan,                 dispo, and discussion of the patient was provided to the oncoming provider.   This time requested to order methadone which was verified as 100 mg for this patient.  [facility].    Tonnya Garbett R Dyshawn Cangelosi, MD

## 2019-07-06 NOTE — ED Notes (Signed)
Assumed care of patient, patient is A&O x3 no complaints at this time patient did inquire about methadone administration.

## 2019-07-07 MED ORDER — LORAZEPAM 1 MG TAB
1 mg | ORAL | Status: DC | PRN
Start: 2019-07-07 — End: 2019-07-07

## 2019-07-07 MED ORDER — LORAZEPAM 2 MG/ML IJ SOLN
2 mg/mL | INTRAMUSCULAR | Status: DC | PRN
Start: 2019-07-07 — End: 2019-07-07

## 2019-07-07 MED ORDER — LORAZEPAM 1 MG TAB
1 mg | Freq: Once | ORAL | Status: DC
Start: 2019-07-07 — End: 2019-07-07

## 2019-07-07 MED ORDER — GABAPENTIN 100 MG CAP
100 mg | Freq: Three times a day (TID) | ORAL | Status: DC
Start: 2019-07-07 — End: 2019-07-07

## 2019-07-07 MED ORDER — THERAPEUTIC MULTIVITAMIN TAB
Freq: Every day | ORAL | Status: DC
Start: 2019-07-07 — End: 2019-07-07
  Administered 2019-07-07: 18:00:00 via ORAL

## 2019-07-07 MED ORDER — ONDANSETRON 4 MG TAB, RAPID DISSOLVE
4 mg | Freq: Three times a day (TID) | ORAL | Status: DC | PRN
Start: 2019-07-07 — End: 2019-07-07

## 2019-07-07 MED ORDER — LORAZEPAM 1 MG TAB
1 mg | Freq: Once | ORAL | Status: AC
Start: 2019-07-07 — End: 2019-07-07
  Administered 2019-07-07: 16:00:00 via ORAL

## 2019-07-07 MED ORDER — METHADONE 10 MG TAB
10 mg | Freq: Every day | ORAL | Status: DC
Start: 2019-07-07 — End: 2019-07-07
  Administered 2019-07-07: 18:00:00 via ORAL

## 2019-07-07 MED ORDER — LORAZEPAM 1 MG TAB
1 mg | ORAL | Status: DC | PRN
Start: 2019-07-07 — End: 2019-07-07
  Administered 2019-07-07: 05:00:00 via ORAL

## 2019-07-07 MED FILL — NICOTINE 21 MG/24 HR DAILY PATCH: 21 mg/24 hr | TRANSDERMAL | Qty: 1

## 2019-07-07 MED FILL — LORAZEPAM 1 MG TAB: 1 mg | ORAL | Qty: 2

## 2019-07-07 MED FILL — CHLORDIAZEPOXIDE 25 MG CAP: 25 mg | ORAL | Qty: 2

## 2019-07-07 MED FILL — GABAPENTIN 100 MG CAP: 100 mg | ORAL | Qty: 1

## 2019-07-07 MED FILL — METHADONE 10 MG TAB: 10 mg | ORAL | Qty: 10

## 2019-07-07 MED FILL — LORAZEPAM 1 MG TAB: 1 mg | ORAL | Qty: 1

## 2019-07-07 MED FILL — DICYCLOMINE 10 MG CAP: 10 mg | ORAL | Qty: 2

## 2019-07-07 MED FILL — THIAMINE MONONITRATE 100 MG TABLET: 100 mg | ORAL | Qty: 1

## 2019-07-07 MED FILL — RISPERIDONE 1 MG TAB, RAPID DISSOLVE: 1 mg | ORAL | Qty: 1

## 2019-07-07 MED FILL — THERAPEUTIC MULTIVITAMIN TAB: ORAL | Qty: 1

## 2019-07-07 NOTE — Progress Notes (Signed)
Patient in bed resting. Will continue to monitor.

## 2019-07-07 NOTE — ED Notes (Signed)
Assume care of pt. At this time.

## 2019-07-07 NOTE — ED Notes (Signed)
 TRANSFER - OUT REPORT:    Verbal report given to TRANSFER - OUT REPORT:    Verbal report given to RN Manuelita (name) on Tiron Suski  being transferred to [facility] (unit) for routine progression of care       Report consisted of patient's Situation, Background, Assessment and   Recommendations(SBAR).     Information from the following report(s) SBAR, ED Summary, [name] and Recent Results was reviewed with the receiving nurse.    Lines:       Opportunity for questions and clarification was provided.      Patient transported with:   Registered Nurse            (name) on Avir Deruiter  being transferred to (unit) for routine progression of care       Report consisted of patient's Situation, Background, Assessment and   Recommendations(SBAR).     Information from the following report(s) SBAR, ED Summary and MAR was reviewed with the receiving nurse.    Lines:       Opportunity for questions and clarification was provided.      Patient transported with:  [facility]

## 2019-07-07 NOTE — ED Notes (Signed)
PATIENT IS BEING SAT WITH SECURITY OFFICER.  PLEASE SEE SCANNED SHEET FOR 15 MINUTE CHECKS

## 2019-07-07 NOTE — ED Notes (Signed)
Report given to Jena Gauss, [facility] transporter.

## 2019-07-07 NOTE — Progress Notes (Signed)
Patient requested methadone. Dosed per Joline Maxcy, RN:   "[facility] located at [address], [place] spoke with Darl Pikes, LPN she verified that Mr. Barco receives 100mg  of Methadone daily. "

## 2019-07-07 NOTE — ED Notes (Signed)
1:27 PM    The patient was signed out to me and care transferred to me by the outgoing provider Dr. Doneta Public at the beginning of my shift.  Briefly, the patient is a 49 y.o. year old male presenting with bipolar disorder and SI    ??  Patient initially evaluated on 07/04/18 @ 2212. Evaluation by behavioral health, Ms. Lokeman @ 07/05/18 @ 0322.   Dx:   ??  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  Axis 11 Deferred  Asxis 111 HTN  Axis V 35    The following items were pending at the time of sign out and discussed with the outgoing provider:    []  Labs  []  Xrays  []  Ultrasound  CT: []  Head   []  Neck   []  Chest   []  Abd/Pelvis    []  Extremity   []  C,T, or L spine  []  Other:   ---------------------------------------------------------------  []  Psych Eval  []  Sobriety  []  Symptomatic improvement  []  Transportation  []  Other:        Plan for disposition is:     []  Discharge   []  Likely Discharge []  Admit  []  Likely Admit  []  Pending   [x]  Transfer or Likely Transfer           []  This patient is in critical condition and was signed out at bedside; a full plan,                 dispo, and discussion of the patient was provided to the oncoming provider.     Patient has been reevaluated by from psych.  Patient accepted by Dr. Is [facility].  The patient is ambulatory alert and appears calm at that this time.  He is stable for transfer.    , MD

## 2019-07-07 NOTE — Progress Notes (Signed)
Pt has been accepted for transfer to [facility]. Pt has Medicare Part A and B  Voluntary Admission to [facility]

## 2019-07-07 NOTE — Progress Notes (Signed)
Patient demanding food and ginger ale despite c/o N/V. Patient was administered bentyl 20 mg po and lorazepam 1 mg po. Patient instructed to give medication  20-30 min before eating, but patient became combative, agitated and attempting to leaving the unit. security was call and patient was redirected to his bed.  Patient in bed eating and drinking at this time. Will continue to monitor.

## 2019-07-07 NOTE — Progress Notes (Signed)
Patient:  Brian Stevenson Age:  49 y.o. DOB:  [birth year]     SEX:  male MRN:  [id number] CSN:  [id number]    07/07/2019  9:32 AM    Interval History:  Patient seen, labs/diagnostics/notes reviewed, discussed with staff.  We discussed psychiatric diagnoses and medications. Patient denied mood as "not good", c/o SI, no plan and withdrawal symptoms.       Mental Status Exam    Patient was alert/oriented to person, place, situation    Hygiene appropriate.   Eye contact intact.   No evidence of psychomotor agitation or retardation. No indication of abnormal involuntary movements noted.   Behavior was cooperative.   Speech regular rate, rhythm and volume.   There was no evidence of formal thought disorder.   Thought process organized  Affect constricted  Mood appears depressed, irritable   Anxiety as anticipated for current situation  Specifically denied thoughts of harming self or others while hospitalized.   Perceptual disturbances reported AH, no overt appearance of psychotic process.  Delusional content: absent in conversation at this time  Abstraction: concrete   Short term memory: intact  Remote memory: unable to assess  Insight poor  Judgement Poor       Data/Labs/Vital Signs    Patient Vitals for the past 24 hrs:   BP Temp Pulse Resp SpO2   07/07/19 0701 132/86 97.6 ??F (36.4 ??C) 71 16 100 %   07/06/19 2217 136/88 98.1 ??F (36.7 ??C) 80 15 100 %   07/06/19 1800 (!) 98/59 97.6 ??F (36.4 ??C) 85 14 97 %       No results found for this or any previous visit (from the past 24 hour(s)).      Current Facility-Administered Medications:   ???  ondansetron (ZOFRAN ODT) tablet 4 mg, 4 mg, Oral, Q8H PRN, Jorja Loa, FNP  ???  LORazepam (ATIVAN) tablet 1 mg, 1 mg, Oral, Q4H PRN, Burgholzer, Sharee Pimple A, NP  ???  LORazepam (ATIVAN) injection 2 mg, 2 mg, IntraMUSCular, Q4H PRN, Burgholzer, Jill A, NP  ???  gabapentin (NEURONTIN) capsule 200 mg, 200 mg, Oral, TID, Burgholzer, Sharee Pimple A, NP  ???  therapeutic multivitamin (THERAGRAN) tablet 1 Tab, 1 Tab,  Oral, DAILY WITH LUNCH, Burgholzer, Sharee Pimple A, NP  ???  LORazepam (ATIVAN) tablet 2 mg, 2 mg, Oral, ONCE, Burgholzer, Sharee Pimple A, NP  ???  dicyclomine (BENTYL) capsule 20 mg, 20 mg, Oral, TID PRN, Via, Kamala, NP, 20 mg at 07/07/19 0023  ???  loperamide (IMODIUM) capsule 2 mg, 2 mg, Oral, PRN, Via, Kamala, NP  ???  risperiDONE (RisperDAL m-tabs) disintegrating tablet 1 mg, 1 mg, Oral, DAILY, Via, Kamala, NP, 1 mg at 07/06/19 1319  ???  thiamine mononitrate (B-1) tablet 100 mg, 100 mg, Oral, DAILY, Maurene Capes, PA-C  ???  chlordiazePOXIDE (LIBRIUM) capsule 50 mg, 50 mg, Oral, Q4H, Maurene Capes, PA-C, 50 mg at 07/06/19 2222  ???  nicotine (NICODERM CQ) 21 mg/24 hr patch 1 Patch, 1 Patch, TransDERmal, DAILY, Maurene Capes, PA-C, 1 Patch at 07/06/19 1320  No current outpatient medications on file.     Patient was seen, labs/diagnostics, notes reviewed and discussed with staff.    Recommendations/Plan    Diagnosis:  1. Bipolar disorder  2. ETOH use disorder  3. Somatic Concerns: Managed by attending.     -Continue current treatment modalities? Yes  -Continue current medications? Increased Gabapentin 200mg  tid-anxiety/ETOH w/d. Changed librium to Ativan due to ease of titration. Discussed indications, risks, benefits and alternatives.   -  Referrals or Consultations needed? None at present   -Discharge Planning: TBD, pending disposition placement.    When medically cleared this patient would likely benefit from inpatient psychiatric admission for:  1. Safety and monitoring  2. Medical evaluation  3. Collateral information  4. Therapeutic milieu/groups  5. Social work consult   6.   Recommend medication management and therapy after discharge.       I certify that this patient's inpatient psychiatric hospital services are required for treatment that could reasonably be expected to improve the patient's condition, or for diagnostic study, and that the patient continues to need, on a daily basis, active treatment furnished directly by or  requiring the supervision of inpatient psychiatric facility personnel. There is no less restrictive alternative than inpatient psychiatric care available for the patient which is consistent with patients welfare and safety.

## 2019-07-07 NOTE — ED Notes (Signed)
Select from the following indications (Choose all that apply):     Suicidal or homicidal patients with no plan, or low to moderate lethal plans, but no means to carry them out. [x]                                    Patients with increased risk factors for suicide, inappropriate mood/affect/behavior, and no suicidal ideation. [x]                                    Patients presenting with suicide attempt that was non-lethal attempt (i.e. scratched wrists). [x]                                    Patients who are moderately agitated with risk for potential violence. [x]                                    Patients who are currently being held on an Emergency Petition (ED only). [x]                                    Any other patients with a potential risk for harm to self or others.

## 2019-07-07 NOTE — Progress Notes (Signed)
Brian Stevenson contacted [facility] and cancelled Pt's transfer request. Staphanie in admission office was contacted.

## 2019-07-07 NOTE — ED Notes (Signed)
Administered 1 tab of multivitamin.    Administered 100mg  of methadone for opioid withdrawal.

## 2019-07-07 NOTE — ED Notes (Signed)
1:27 PM    The patient was signed out to me and care transferred to me by the outgoing provider Dr. Parkes at the beginning of my shift.  Briefly, the patient is a 48 y.o. year old male presenting with bipolar disorder and SI    ??  Patient initially evaluated on 07/04/18 @ 2212. Evaluation by behavioral health, Ms. Lokeman @ 07/05/18 @ 0322.   Dx:   ??  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  Axis 11 Deferred  Asxis 111 HTN  Axis V 35    The following items were pending at the time of sign out and discussed with the outgoing provider:    [] Labs  [] Xrays  [] Ultrasound  CT: [] Head   [] Neck   [] Chest   [] Abd/Pelvis    [] Extremity   [] C,T, or L spine  [] Other:   ---------------------------------------------------------------  [] Psych Eval  [] Sobriety  [] Symptomatic improvement  [] Transportation  [] Other:        Plan for disposition is:     [] Discharge   [] Likely Discharge [] Admit  [] Likely Admit  [] Pending   [x] Transfer or Likely Transfer           [] This patient is in critical condition and was signed out at bedside; a full plan,                 dispo, and discussion of the patient was provided to the oncoming provider.     Patient has been reevaluated by Jill Burke Rossow from psych.  Patient accepted by Dr. Is Z[facility].  The patient is ambulatory alert and appears calm at that this time.  He is stable for transfer.    Eshaal Duby R Garry Bochicchio, MD

## 2019-07-07 NOTE — ED Notes (Signed)
Administered 1 tab of multivitamin.    Administered 100mg of methadone for opioid withdrawal.

## 2019-07-07 NOTE — Progress Notes (Signed)
Pt has been accepted for transfer to [facility]. Pt has Medicare Part A and B  Voluntary Admission to [facility]

## 2019-07-07 NOTE — ED Notes (Signed)
Report given to Hugh, [facility] transporter.

## 2019-07-07 NOTE — Progress Notes (Signed)
SW Taniera Lokeman contacted [facility] and cancelled Pt's transfer request. Staphanie in admission office was contacted.

## 2019-07-07 NOTE — Progress Notes (Signed)
Patient:  Brian Stevenson Age:  49 y.o. DOB:  07-12-1970     SEX:  male MRN:  [id number] CSN:  [id number]    07/07/2019  9:32 AM    Interval History:  Patient seen, labs/diagnostics/notes reviewed, discussed with staff.  We discussed psychiatric diagnoses and medications. Patient denied mood as "not good", c/o SI, no plan and withdrawal symptoms.       Mental Status Exam    Patient was alert/oriented to person, place, situation    Hygiene appropriate.   Eye contact intact.   No evidence of psychomotor agitation or retardation. No indication of abnormal involuntary movements noted.   Behavior was cooperative.   Speech regular rate, rhythm and volume.   There was no evidence of formal thought disorder.   Thought process organized  Affect constricted  Mood appears depressed, irritable   Anxiety as anticipated for current situation  Specifically denied thoughts of harming self or others while hospitalized.   Perceptual disturbances reported AH, no overt appearance of psychotic process.  Delusional content: absent in conversation at this time  Abstraction: concrete   Short term memory: intact  Remote memory: unable to assess  Insight poor  Judgement Poor       Data/Labs/Vital Signs    Patient Vitals for the past 24 hrs:   BP Temp Pulse Resp SpO2   07/07/19 0701 132/86 97.6 ??F (36.4 ??C) 71 16 100 %   07/06/19 2217 136/88 98.1 ??F (36.7 ??C) 80 15 100 %   07/06/19 1800 (!) 98/59 97.6 ??F (36.4 ??C) 85 14 97 %       No results found for this or any previous visit (from the past 24 hour(s)).      Current Facility-Administered Medications:   ???  ondansetron (ZOFRAN ODT) tablet 4 mg, 4 mg, Oral, Q8H PRN, Marc Morgans, FNP  ???  LORazepam (ATIVAN) tablet 1 mg, 1 mg, Oral, Q4H PRN, Jenafer Winterton, Noreene Larsson A, NP  ???  LORazepam (ATIVAN) injection 2 mg, 2 mg, IntraMUSCular, Q4H PRN, Zyanna Leisinger A, NP  ???  gabapentin (NEURONTIN) capsule 200 mg, 200 mg, Oral, TID, Keidrick Murty, Noreene Larsson A, NP   ???  therapeutic multivitamin (THERAGRAN) tablet 1 Tab, 1 Tab, Oral, DAILY WITH LUNCH, Zoiee Wimmer, Noreene Larsson A, NP  ???  LORazepam (ATIVAN) tablet 2 mg, 2 mg, Oral, ONCE, Filimon Miranda, Noreene Larsson A, NP  ???  dicyclomine (BENTYL) capsule 20 mg, 20 mg, Oral, TID PRN, Via, Kamala, NP, 20 mg at 07/07/19 0023  ???  loperamide (IMODIUM) capsule 2 mg, 2 mg, Oral, PRN, Via, Kamala, NP  ???  risperiDONE (RisperDAL m-tabs) disintegrating tablet 1 mg, 1 mg, Oral, DAILY, Via, Kamala, NP, 1 mg at 07/06/19 1319  ???  thiamine mononitrate (B-1) tablet 100 mg, 100 mg, Oral, DAILY, Fransisca Kaufmann, PA-C  ???  chlordiazePOXIDE (LIBRIUM) capsule 50 mg, 50 mg, Oral, Q4H, Fransisca Kaufmann, PA-C, 50 mg at 07/06/19 2222  ???  nicotine (NICODERM CQ) 21 mg/24 hr patch 1 Patch, 1 Patch, TransDERmal, DAILY, Fransisca Kaufmann, PA-C, 1 Patch at 07/06/19 1320  No current outpatient medications on file.     Patient was seen, labs/diagnostics, notes reviewed and discussed with staff.    Recommendations/Plan    Diagnosis:  1. Bipolar disorder  2. ETOH use disorder  3. Somatic Concerns: Managed by attending.     -Continue current treatment modalities? Yes  -Continue current medications? Increased Gabapentin 200mg  tid-anxiety/ETOH w/d. Changed librium to Ativan due to ease of titration. Discussed indications, risks, benefits and alternatives.   -  Referrals or Consultations needed? None at present   -Discharge Planning: TBD, pending disposition placement.    When medically cleared this patient would likely benefit from inpatient psychiatric admission for:  1. Safety and monitoring  2. Medical evaluation  3. Collateral information  4. Therapeutic milieu/groups  5. Social work consult   6.   Recommend medication management and therapy after discharge.        I certify that this patient's inpatient psychiatric hospital services are required for treatment that could reasonably be expected to improve the patient's condition, or for diagnostic study, and that the patient continues to need, on a daily basis, active treatment furnished directly by or requiring the supervision of inpatient psychiatric facility personnel. There is no less restrictive alternative than inpatient psychiatric care available for the patient which is consistent with patients welfare and safety.

## 2019-07-07 NOTE — Progress Notes (Signed)
Patient requested methadone. Dosed per Bailee Jones, RN:   "[facility] located at [address] [place], [place] spoke with [place], LPN she verified that Mr. 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 "

## 2019-07-07 NOTE — ED Notes (Signed)
TRANSFER - OUT REPORT:    Verbal report given to TRANSFER - OUT REPORT:    Verbal report given to RN Lindsay (name) on Brian Stevenson  being transferred to [facility] (unit) for routine progression of care       Report consisted of patient???s Situation, Background, Assessment and   Recommendations(SBAR).     Information from the following report(s) SBAR, ED Summary, MAR and Recent Results was reviewed with the receiving nurse.    Lines:       Opportunity for questions and clarification was provided.      Patient transported with:   Registered Nurse            (name) on Brian Stevenson  being transferred to (unit) for routine progression of care       Report consisted of patient???s Situation, Background, Assessment and   Recommendations(SBAR).     Information from the following report(s) SBAR, ED Summary and MAR was reviewed with the receiving nurse.    Lines:       Opportunity for questions and clarification was provided.      Patient transported with:  Pulse

## 2019-07-07 NOTE — Progress Notes (Signed)
Patient demanding food and ginger ale despite c/o N/V. Patient was administered bentyl 20 mg po and lorazepam 1 mg po. Patient instructed to give medication  20-30 min before eating, but patient became combative, agitated and attempting to leaving the unit. security was call and patient was redirected to his bed.  Patient in bed eating and drinking at this time. Will continue to monitor.
